# Patient Record
Sex: Male | Born: 1960
Health system: Southern US, Community
[De-identification: ages and names within clinical notes are randomized; demographics above are authoritative.]

## PROBLEM LIST (undated history)

## (undated) DIAGNOSIS — R000 Tachycardia, unspecified: Secondary | ICD-10-CM

## (undated) DIAGNOSIS — J449 Chronic obstructive pulmonary disease, unspecified: Secondary | ICD-10-CM

## (undated) DIAGNOSIS — I639 Cerebral infarction, unspecified: Secondary | ICD-10-CM

## (undated) HISTORY — PX: CHOLECYSTECTOMY: SHX55

---

## 2016-10-24 DIAGNOSIS — E559 Vitamin D deficiency, unspecified: Secondary | ICD-10-CM | POA: Diagnosis not present

## 2016-10-24 DIAGNOSIS — Z720 Tobacco use: Secondary | ICD-10-CM | POA: Diagnosis not present

## 2016-10-24 DIAGNOSIS — J449 Chronic obstructive pulmonary disease, unspecified: Secondary | ICD-10-CM | POA: Diagnosis not present

## 2016-10-24 DIAGNOSIS — K5790 Diverticulosis of intestine, part unspecified, without perforation or abscess without bleeding: Secondary | ICD-10-CM | POA: Diagnosis not present

## 2016-10-24 DIAGNOSIS — K5900 Constipation, unspecified: Secondary | ICD-10-CM | POA: Diagnosis not present

## 2016-10-24 DIAGNOSIS — G4700 Insomnia, unspecified: Secondary | ICD-10-CM | POA: Diagnosis not present

## 2016-10-24 DIAGNOSIS — R7303 Prediabetes: Secondary | ICD-10-CM | POA: Diagnosis not present

## 2016-10-24 DIAGNOSIS — E785 Hyperlipidemia, unspecified: Secondary | ICD-10-CM | POA: Diagnosis not present

## 2016-11-23 DIAGNOSIS — E785 Hyperlipidemia, unspecified: Secondary | ICD-10-CM | POA: Diagnosis not present

## 2016-11-23 DIAGNOSIS — G4700 Insomnia, unspecified: Secondary | ICD-10-CM | POA: Diagnosis not present

## 2016-11-23 DIAGNOSIS — J449 Chronic obstructive pulmonary disease, unspecified: Secondary | ICD-10-CM | POA: Diagnosis not present

## 2016-11-23 DIAGNOSIS — K5900 Constipation, unspecified: Secondary | ICD-10-CM | POA: Diagnosis not present

## 2016-11-23 DIAGNOSIS — Z720 Tobacco use: Secondary | ICD-10-CM | POA: Diagnosis not present

## 2016-11-23 DIAGNOSIS — E559 Vitamin D deficiency, unspecified: Secondary | ICD-10-CM | POA: Diagnosis not present

## 2016-11-23 DIAGNOSIS — R7303 Prediabetes: Secondary | ICD-10-CM | POA: Diagnosis not present

## 2016-11-23 DIAGNOSIS — K5790 Diverticulosis of intestine, part unspecified, without perforation or abscess without bleeding: Secondary | ICD-10-CM | POA: Diagnosis not present

## 2016-12-06 DIAGNOSIS — R194 Change in bowel habit: Secondary | ICD-10-CM | POA: Diagnosis not present

## 2016-12-06 DIAGNOSIS — Z1211 Encounter for screening for malignant neoplasm of colon: Secondary | ICD-10-CM | POA: Diagnosis not present

## 2016-12-15 DIAGNOSIS — K648 Other hemorrhoids: Secondary | ICD-10-CM | POA: Diagnosis not present

## 2016-12-15 DIAGNOSIS — D120 Benign neoplasm of cecum: Secondary | ICD-10-CM | POA: Diagnosis not present

## 2016-12-15 DIAGNOSIS — Z0000 Encounter for general adult medical examination without abnormal findings: Secondary | ICD-10-CM | POA: Diagnosis not present

## 2016-12-15 DIAGNOSIS — D126 Benign neoplasm of colon, unspecified: Secondary | ICD-10-CM | POA: Diagnosis not present

## 2016-12-15 DIAGNOSIS — K5730 Diverticulosis of large intestine without perforation or abscess without bleeding: Secondary | ICD-10-CM | POA: Diagnosis not present

## 2016-12-15 DIAGNOSIS — Z1211 Encounter for screening for malignant neoplasm of colon: Secondary | ICD-10-CM | POA: Diagnosis not present

## 2016-12-15 DIAGNOSIS — K621 Rectal polyp: Secondary | ICD-10-CM | POA: Diagnosis not present

## 2017-02-20 DIAGNOSIS — R7303 Prediabetes: Secondary | ICD-10-CM | POA: Diagnosis not present

## 2017-02-20 DIAGNOSIS — E785 Hyperlipidemia, unspecified: Secondary | ICD-10-CM | POA: Diagnosis not present

## 2017-02-20 DIAGNOSIS — Z0000 Encounter for general adult medical examination without abnormal findings: Secondary | ICD-10-CM | POA: Diagnosis not present

## 2017-02-20 DIAGNOSIS — K5900 Constipation, unspecified: Secondary | ICD-10-CM | POA: Diagnosis not present

## 2017-02-20 DIAGNOSIS — E559 Vitamin D deficiency, unspecified: Secondary | ICD-10-CM | POA: Diagnosis not present

## 2017-02-20 DIAGNOSIS — K5790 Diverticulosis of intestine, part unspecified, without perforation or abscess without bleeding: Secondary | ICD-10-CM | POA: Diagnosis not present

## 2017-02-20 DIAGNOSIS — G4700 Insomnia, unspecified: Secondary | ICD-10-CM | POA: Diagnosis not present

## 2017-02-20 DIAGNOSIS — J449 Chronic obstructive pulmonary disease, unspecified: Secondary | ICD-10-CM | POA: Diagnosis not present

## 2017-02-20 DIAGNOSIS — Z720 Tobacco use: Secondary | ICD-10-CM | POA: Diagnosis not present

## 2017-05-22 DIAGNOSIS — K5900 Constipation, unspecified: Secondary | ICD-10-CM | POA: Diagnosis not present

## 2017-05-22 DIAGNOSIS — K5790 Diverticulosis of intestine, part unspecified, without perforation or abscess without bleeding: Secondary | ICD-10-CM | POA: Diagnosis not present

## 2017-05-22 DIAGNOSIS — Z5181 Encounter for therapeutic drug level monitoring: Secondary | ICD-10-CM | POA: Diagnosis not present

## 2017-05-22 DIAGNOSIS — G4700 Insomnia, unspecified: Secondary | ICD-10-CM | POA: Diagnosis not present

## 2017-05-22 DIAGNOSIS — R7303 Prediabetes: Secondary | ICD-10-CM | POA: Diagnosis not present

## 2017-05-22 DIAGNOSIS — E559 Vitamin D deficiency, unspecified: Secondary | ICD-10-CM | POA: Diagnosis not present

## 2017-05-22 DIAGNOSIS — Z720 Tobacco use: Secondary | ICD-10-CM | POA: Diagnosis not present

## 2017-05-22 DIAGNOSIS — R002 Palpitations: Secondary | ICD-10-CM | POA: Diagnosis not present

## 2017-05-22 DIAGNOSIS — E785 Hyperlipidemia, unspecified: Secondary | ICD-10-CM | POA: Diagnosis not present

## 2017-05-22 DIAGNOSIS — J449 Chronic obstructive pulmonary disease, unspecified: Secondary | ICD-10-CM | POA: Diagnosis not present

## 2017-05-25 DIAGNOSIS — I10 Essential (primary) hypertension: Secondary | ICD-10-CM | POA: Diagnosis not present

## 2017-05-25 DIAGNOSIS — R7989 Other specified abnormal findings of blood chemistry: Secondary | ICD-10-CM | POA: Diagnosis not present

## 2017-05-25 DIAGNOSIS — R9431 Abnormal electrocardiogram [ECG] [EKG]: Secondary | ICD-10-CM | POA: Diagnosis not present

## 2017-05-25 DIAGNOSIS — R0609 Other forms of dyspnea: Secondary | ICD-10-CM | POA: Diagnosis not present

## 2017-08-01 DIAGNOSIS — E0590 Thyrotoxicosis, unspecified without thyrotoxic crisis or storm: Secondary | ICD-10-CM | POA: Insufficient documentation

## 2017-08-01 DIAGNOSIS — Z23 Encounter for immunization: Secondary | ICD-10-CM | POA: Diagnosis not present

## 2017-09-04 DIAGNOSIS — J449 Chronic obstructive pulmonary disease, unspecified: Secondary | ICD-10-CM | POA: Diagnosis not present

## 2017-09-04 DIAGNOSIS — E0590 Thyrotoxicosis, unspecified without thyrotoxic crisis or storm: Secondary | ICD-10-CM | POA: Diagnosis not present

## 2017-09-04 DIAGNOSIS — K5900 Constipation, unspecified: Secondary | ICD-10-CM | POA: Diagnosis not present

## 2017-09-04 DIAGNOSIS — K5790 Diverticulosis of intestine, part unspecified, without perforation or abscess without bleeding: Secondary | ICD-10-CM | POA: Diagnosis not present

## 2017-09-04 DIAGNOSIS — E559 Vitamin D deficiency, unspecified: Secondary | ICD-10-CM | POA: Diagnosis not present

## 2017-09-04 DIAGNOSIS — E785 Hyperlipidemia, unspecified: Secondary | ICD-10-CM | POA: Diagnosis not present

## 2017-09-04 DIAGNOSIS — R630 Anorexia: Secondary | ICD-10-CM | POA: Diagnosis not present

## 2017-09-04 DIAGNOSIS — R7303 Prediabetes: Secondary | ICD-10-CM | POA: Diagnosis not present

## 2017-09-04 DIAGNOSIS — R002 Palpitations: Secondary | ICD-10-CM | POA: Diagnosis not present

## 2017-09-04 DIAGNOSIS — I10 Essential (primary) hypertension: Secondary | ICD-10-CM | POA: Diagnosis not present

## 2017-09-04 DIAGNOSIS — G4700 Insomnia, unspecified: Secondary | ICD-10-CM | POA: Diagnosis not present

## 2017-09-04 DIAGNOSIS — R0609 Other forms of dyspnea: Secondary | ICD-10-CM | POA: Diagnosis not present

## 2017-09-04 DIAGNOSIS — Z720 Tobacco use: Secondary | ICD-10-CM | POA: Diagnosis not present

## 2017-09-18 DIAGNOSIS — E0590 Thyrotoxicosis, unspecified without thyrotoxic crisis or storm: Secondary | ICD-10-CM | POA: Diagnosis not present

## 2017-11-06 DIAGNOSIS — E785 Hyperlipidemia, unspecified: Secondary | ICD-10-CM | POA: Diagnosis not present

## 2017-11-06 DIAGNOSIS — I10 Essential (primary) hypertension: Secondary | ICD-10-CM | POA: Diagnosis not present

## 2017-11-06 DIAGNOSIS — E559 Vitamin D deficiency, unspecified: Secondary | ICD-10-CM | POA: Diagnosis not present

## 2017-11-06 DIAGNOSIS — K5790 Diverticulosis of intestine, part unspecified, without perforation or abscess without bleeding: Secondary | ICD-10-CM | POA: Diagnosis not present

## 2017-11-06 DIAGNOSIS — K5900 Constipation, unspecified: Secondary | ICD-10-CM | POA: Diagnosis not present

## 2017-11-06 DIAGNOSIS — R7303 Prediabetes: Secondary | ICD-10-CM | POA: Diagnosis not present

## 2017-11-06 DIAGNOSIS — E0590 Thyrotoxicosis, unspecified without thyrotoxic crisis or storm: Secondary | ICD-10-CM | POA: Diagnosis not present

## 2017-11-06 DIAGNOSIS — G4700 Insomnia, unspecified: Secondary | ICD-10-CM | POA: Diagnosis not present

## 2017-11-06 DIAGNOSIS — Z720 Tobacco use: Secondary | ICD-10-CM | POA: Diagnosis not present

## 2017-11-06 DIAGNOSIS — J449 Chronic obstructive pulmonary disease, unspecified: Secondary | ICD-10-CM | POA: Diagnosis not present

## 2017-12-04 DIAGNOSIS — E0590 Thyrotoxicosis, unspecified without thyrotoxic crisis or storm: Secondary | ICD-10-CM | POA: Diagnosis not present

## 2017-12-04 DIAGNOSIS — R7303 Prediabetes: Secondary | ICD-10-CM | POA: Diagnosis not present

## 2017-12-04 DIAGNOSIS — J449 Chronic obstructive pulmonary disease, unspecified: Secondary | ICD-10-CM | POA: Diagnosis not present

## 2017-12-04 DIAGNOSIS — K5790 Diverticulosis of intestine, part unspecified, without perforation or abscess without bleeding: Secondary | ICD-10-CM | POA: Diagnosis not present

## 2017-12-04 DIAGNOSIS — Z720 Tobacco use: Secondary | ICD-10-CM | POA: Diagnosis not present

## 2017-12-04 DIAGNOSIS — I10 Essential (primary) hypertension: Secondary | ICD-10-CM | POA: Diagnosis not present

## 2017-12-04 DIAGNOSIS — E559 Vitamin D deficiency, unspecified: Secondary | ICD-10-CM | POA: Diagnosis not present

## 2017-12-04 DIAGNOSIS — E785 Hyperlipidemia, unspecified: Secondary | ICD-10-CM | POA: Diagnosis not present

## 2017-12-04 DIAGNOSIS — G4700 Insomnia, unspecified: Secondary | ICD-10-CM | POA: Diagnosis not present

## 2017-12-04 DIAGNOSIS — K5900 Constipation, unspecified: Secondary | ICD-10-CM | POA: Diagnosis not present

## 2018-02-21 DIAGNOSIS — K5790 Diverticulosis of intestine, part unspecified, without perforation or abscess without bleeding: Secondary | ICD-10-CM | POA: Diagnosis not present

## 2018-02-21 DIAGNOSIS — E0590 Thyrotoxicosis, unspecified without thyrotoxic crisis or storm: Secondary | ICD-10-CM | POA: Diagnosis not present

## 2018-02-21 DIAGNOSIS — Z720 Tobacco use: Secondary | ICD-10-CM | POA: Diagnosis not present

## 2018-02-21 DIAGNOSIS — E785 Hyperlipidemia, unspecified: Secondary | ICD-10-CM | POA: Diagnosis not present

## 2018-02-21 DIAGNOSIS — R7303 Prediabetes: Secondary | ICD-10-CM | POA: Diagnosis not present

## 2018-02-21 DIAGNOSIS — Z0000 Encounter for general adult medical examination without abnormal findings: Secondary | ICD-10-CM | POA: Diagnosis not present

## 2018-02-21 DIAGNOSIS — I10 Essential (primary) hypertension: Secondary | ICD-10-CM | POA: Diagnosis not present

## 2018-02-21 DIAGNOSIS — G4700 Insomnia, unspecified: Secondary | ICD-10-CM | POA: Diagnosis not present

## 2018-02-21 DIAGNOSIS — K5900 Constipation, unspecified: Secondary | ICD-10-CM | POA: Diagnosis not present

## 2018-02-21 DIAGNOSIS — J449 Chronic obstructive pulmonary disease, unspecified: Secondary | ICD-10-CM | POA: Diagnosis not present

## 2018-02-21 DIAGNOSIS — E559 Vitamin D deficiency, unspecified: Secondary | ICD-10-CM | POA: Diagnosis not present

## 2018-04-04 DIAGNOSIS — E0590 Thyrotoxicosis, unspecified without thyrotoxic crisis or storm: Secondary | ICD-10-CM | POA: Diagnosis not present

## 2018-04-12 DIAGNOSIS — I10 Essential (primary) hypertension: Secondary | ICD-10-CM | POA: Diagnosis not present

## 2018-04-12 DIAGNOSIS — Z720 Tobacco use: Secondary | ICD-10-CM | POA: Diagnosis not present

## 2018-04-12 DIAGNOSIS — J449 Chronic obstructive pulmonary disease, unspecified: Secondary | ICD-10-CM | POA: Diagnosis not present

## 2018-04-12 DIAGNOSIS — T7840XA Allergy, unspecified, initial encounter: Secondary | ICD-10-CM | POA: Diagnosis not present

## 2018-04-12 DIAGNOSIS — E785 Hyperlipidemia, unspecified: Secondary | ICD-10-CM | POA: Diagnosis not present

## 2018-04-12 DIAGNOSIS — E559 Vitamin D deficiency, unspecified: Secondary | ICD-10-CM | POA: Diagnosis not present

## 2018-04-12 DIAGNOSIS — Z01118 Encounter for examination of ears and hearing with other abnormal findings: Secondary | ICD-10-CM | POA: Diagnosis not present

## 2018-04-12 DIAGNOSIS — G4700 Insomnia, unspecified: Secondary | ICD-10-CM | POA: Diagnosis not present

## 2018-04-12 DIAGNOSIS — E0590 Thyrotoxicosis, unspecified without thyrotoxic crisis or storm: Secondary | ICD-10-CM | POA: Diagnosis not present

## 2018-04-12 DIAGNOSIS — K5900 Constipation, unspecified: Secondary | ICD-10-CM | POA: Diagnosis not present

## 2018-04-12 DIAGNOSIS — R7303 Prediabetes: Secondary | ICD-10-CM | POA: Diagnosis not present

## 2018-04-12 DIAGNOSIS — K5790 Diverticulosis of intestine, part unspecified, without perforation or abscess without bleeding: Secondary | ICD-10-CM | POA: Diagnosis not present

## 2018-04-12 DIAGNOSIS — Z125 Encounter for screening for malignant neoplasm of prostate: Secondary | ICD-10-CM | POA: Diagnosis not present

## 2018-05-30 DIAGNOSIS — Z720 Tobacco use: Secondary | ICD-10-CM | POA: Diagnosis not present

## 2018-05-30 DIAGNOSIS — K5900 Constipation, unspecified: Secondary | ICD-10-CM | POA: Diagnosis not present

## 2018-05-30 DIAGNOSIS — I10 Essential (primary) hypertension: Secondary | ICD-10-CM | POA: Diagnosis not present

## 2018-05-30 DIAGNOSIS — G4700 Insomnia, unspecified: Secondary | ICD-10-CM | POA: Diagnosis not present

## 2018-05-30 DIAGNOSIS — E559 Vitamin D deficiency, unspecified: Secondary | ICD-10-CM | POA: Diagnosis not present

## 2018-05-30 DIAGNOSIS — J449 Chronic obstructive pulmonary disease, unspecified: Secondary | ICD-10-CM | POA: Diagnosis not present

## 2018-05-30 DIAGNOSIS — E0590 Thyrotoxicosis, unspecified without thyrotoxic crisis or storm: Secondary | ICD-10-CM | POA: Diagnosis not present

## 2018-05-30 DIAGNOSIS — E785 Hyperlipidemia, unspecified: Secondary | ICD-10-CM | POA: Diagnosis not present

## 2018-05-30 DIAGNOSIS — K5790 Diverticulosis of intestine, part unspecified, without perforation or abscess without bleeding: Secondary | ICD-10-CM | POA: Diagnosis not present

## 2018-05-30 DIAGNOSIS — R7303 Prediabetes: Secondary | ICD-10-CM | POA: Diagnosis not present

## 2018-07-20 DIAGNOSIS — Z23 Encounter for immunization: Secondary | ICD-10-CM | POA: Diagnosis not present

## 2018-08-05 DIAGNOSIS — E0590 Thyrotoxicosis, unspecified without thyrotoxic crisis or storm: Secondary | ICD-10-CM | POA: Diagnosis not present

## 2018-08-13 DIAGNOSIS — Z720 Tobacco use: Secondary | ICD-10-CM | POA: Diagnosis not present

## 2018-08-13 DIAGNOSIS — I10 Essential (primary) hypertension: Secondary | ICD-10-CM | POA: Diagnosis not present

## 2018-08-13 DIAGNOSIS — R7303 Prediabetes: Secondary | ICD-10-CM | POA: Diagnosis not present

## 2018-08-13 DIAGNOSIS — E785 Hyperlipidemia, unspecified: Secondary | ICD-10-CM | POA: Diagnosis not present

## 2018-08-13 DIAGNOSIS — K5790 Diverticulosis of intestine, part unspecified, without perforation or abscess without bleeding: Secondary | ICD-10-CM | POA: Diagnosis not present

## 2018-08-13 DIAGNOSIS — E559 Vitamin D deficiency, unspecified: Secondary | ICD-10-CM | POA: Diagnosis not present

## 2018-08-13 DIAGNOSIS — K5900 Constipation, unspecified: Secondary | ICD-10-CM | POA: Diagnosis not present

## 2018-08-13 DIAGNOSIS — J449 Chronic obstructive pulmonary disease, unspecified: Secondary | ICD-10-CM | POA: Diagnosis not present

## 2018-08-13 DIAGNOSIS — G4700 Insomnia, unspecified: Secondary | ICD-10-CM | POA: Diagnosis not present

## 2018-08-13 DIAGNOSIS — E0590 Thyrotoxicosis, unspecified without thyrotoxic crisis or storm: Secondary | ICD-10-CM | POA: Diagnosis not present

## 2018-10-17 DIAGNOSIS — G4700 Insomnia, unspecified: Secondary | ICD-10-CM | POA: Diagnosis not present

## 2018-10-17 DIAGNOSIS — Z125 Encounter for screening for malignant neoplasm of prostate: Secondary | ICD-10-CM | POA: Diagnosis not present

## 2018-10-17 DIAGNOSIS — R7303 Prediabetes: Secondary | ICD-10-CM | POA: Diagnosis not present

## 2018-10-17 DIAGNOSIS — I10 Essential (primary) hypertension: Secondary | ICD-10-CM | POA: Diagnosis not present

## 2018-10-17 DIAGNOSIS — N410 Acute prostatitis: Secondary | ICD-10-CM | POA: Diagnosis not present

## 2018-10-17 DIAGNOSIS — Z720 Tobacco use: Secondary | ICD-10-CM | POA: Diagnosis not present

## 2018-10-17 DIAGNOSIS — K5790 Diverticulosis of intestine, part unspecified, without perforation or abscess without bleeding: Secondary | ICD-10-CM | POA: Diagnosis not present

## 2018-10-17 DIAGNOSIS — E0590 Thyrotoxicosis, unspecified without thyrotoxic crisis or storm: Secondary | ICD-10-CM | POA: Diagnosis not present

## 2018-10-17 DIAGNOSIS — K5900 Constipation, unspecified: Secondary | ICD-10-CM | POA: Diagnosis not present

## 2018-10-17 DIAGNOSIS — E559 Vitamin D deficiency, unspecified: Secondary | ICD-10-CM | POA: Diagnosis not present

## 2018-10-17 DIAGNOSIS — E785 Hyperlipidemia, unspecified: Secondary | ICD-10-CM | POA: Diagnosis not present

## 2018-10-17 DIAGNOSIS — J449 Chronic obstructive pulmonary disease, unspecified: Secondary | ICD-10-CM | POA: Diagnosis not present

## 2018-10-29 DIAGNOSIS — E0590 Thyrotoxicosis, unspecified without thyrotoxic crisis or storm: Secondary | ICD-10-CM | POA: Diagnosis not present

## 2018-10-29 DIAGNOSIS — G4700 Insomnia, unspecified: Secondary | ICD-10-CM | POA: Diagnosis not present

## 2018-10-29 DIAGNOSIS — E785 Hyperlipidemia, unspecified: Secondary | ICD-10-CM | POA: Diagnosis not present

## 2018-10-29 DIAGNOSIS — Z720 Tobacco use: Secondary | ICD-10-CM | POA: Diagnosis not present

## 2018-10-29 DIAGNOSIS — R7303 Prediabetes: Secondary | ICD-10-CM | POA: Diagnosis not present

## 2018-10-29 DIAGNOSIS — I10 Essential (primary) hypertension: Secondary | ICD-10-CM | POA: Diagnosis not present

## 2018-10-29 DIAGNOSIS — K5900 Constipation, unspecified: Secondary | ICD-10-CM | POA: Diagnosis not present

## 2018-10-29 DIAGNOSIS — E559 Vitamin D deficiency, unspecified: Secondary | ICD-10-CM | POA: Diagnosis not present

## 2018-10-29 DIAGNOSIS — J449 Chronic obstructive pulmonary disease, unspecified: Secondary | ICD-10-CM | POA: Diagnosis not present

## 2018-12-09 DIAGNOSIS — E0590 Thyrotoxicosis, unspecified without thyrotoxic crisis or storm: Secondary | ICD-10-CM | POA: Diagnosis not present

## 2019-02-25 DIAGNOSIS — E0590 Thyrotoxicosis, unspecified without thyrotoxic crisis or storm: Secondary | ICD-10-CM | POA: Diagnosis not present

## 2019-02-25 DIAGNOSIS — I10 Essential (primary) hypertension: Secondary | ICD-10-CM | POA: Diagnosis not present

## 2019-02-25 DIAGNOSIS — E785 Hyperlipidemia, unspecified: Secondary | ICD-10-CM | POA: Diagnosis not present

## 2019-02-25 DIAGNOSIS — R7303 Prediabetes: Secondary | ICD-10-CM | POA: Diagnosis not present

## 2019-02-25 DIAGNOSIS — K5900 Constipation, unspecified: Secondary | ICD-10-CM | POA: Diagnosis not present

## 2019-02-25 DIAGNOSIS — E559 Vitamin D deficiency, unspecified: Secondary | ICD-10-CM | POA: Diagnosis not present

## 2019-02-25 DIAGNOSIS — Z720 Tobacco use: Secondary | ICD-10-CM | POA: Diagnosis not present

## 2019-02-25 DIAGNOSIS — G4700 Insomnia, unspecified: Secondary | ICD-10-CM | POA: Diagnosis not present

## 2019-02-25 DIAGNOSIS — J449 Chronic obstructive pulmonary disease, unspecified: Secondary | ICD-10-CM | POA: Diagnosis not present

## 2019-07-23 DIAGNOSIS — E0590 Thyrotoxicosis, unspecified without thyrotoxic crisis or storm: Secondary | ICD-10-CM | POA: Diagnosis not present

## 2019-07-29 DIAGNOSIS — I10 Essential (primary) hypertension: Secondary | ICD-10-CM | POA: Diagnosis not present

## 2019-07-29 DIAGNOSIS — E559 Vitamin D deficiency, unspecified: Secondary | ICD-10-CM | POA: Diagnosis not present

## 2019-07-29 DIAGNOSIS — G4700 Insomnia, unspecified: Secondary | ICD-10-CM | POA: Diagnosis not present

## 2019-07-29 DIAGNOSIS — J449 Chronic obstructive pulmonary disease, unspecified: Secondary | ICD-10-CM | POA: Diagnosis not present

## 2019-07-29 DIAGNOSIS — K5900 Constipation, unspecified: Secondary | ICD-10-CM | POA: Diagnosis not present

## 2019-07-29 DIAGNOSIS — Z0001 Encounter for general adult medical examination with abnormal findings: Secondary | ICD-10-CM | POA: Diagnosis not present

## 2019-07-29 DIAGNOSIS — E0590 Thyrotoxicosis, unspecified without thyrotoxic crisis or storm: Secondary | ICD-10-CM | POA: Diagnosis not present

## 2019-07-29 DIAGNOSIS — Z1389 Encounter for screening for other disorder: Secondary | ICD-10-CM | POA: Diagnosis not present

## 2019-07-29 DIAGNOSIS — E785 Hyperlipidemia, unspecified: Secondary | ICD-10-CM | POA: Diagnosis not present

## 2019-07-29 DIAGNOSIS — Z720 Tobacco use: Secondary | ICD-10-CM | POA: Diagnosis not present

## 2019-07-29 DIAGNOSIS — R7303 Prediabetes: Secondary | ICD-10-CM | POA: Diagnosis not present

## 2019-07-29 DIAGNOSIS — Z23 Encounter for immunization: Secondary | ICD-10-CM | POA: Diagnosis not present

## 2019-09-25 DIAGNOSIS — E0590 Thyrotoxicosis, unspecified without thyrotoxic crisis or storm: Secondary | ICD-10-CM | POA: Diagnosis not present

## 2019-10-30 DIAGNOSIS — E0590 Thyrotoxicosis, unspecified without thyrotoxic crisis or storm: Secondary | ICD-10-CM | POA: Diagnosis not present

## 2019-10-30 DIAGNOSIS — R7303 Prediabetes: Secondary | ICD-10-CM | POA: Diagnosis not present

## 2019-10-30 DIAGNOSIS — E785 Hyperlipidemia, unspecified: Secondary | ICD-10-CM | POA: Diagnosis not present

## 2019-10-30 DIAGNOSIS — K5900 Constipation, unspecified: Secondary | ICD-10-CM | POA: Diagnosis not present

## 2019-10-30 DIAGNOSIS — E559 Vitamin D deficiency, unspecified: Secondary | ICD-10-CM | POA: Diagnosis not present

## 2019-10-30 DIAGNOSIS — I10 Essential (primary) hypertension: Secondary | ICD-10-CM | POA: Diagnosis not present

## 2019-10-30 DIAGNOSIS — Z720 Tobacco use: Secondary | ICD-10-CM | POA: Diagnosis not present

## 2019-10-30 DIAGNOSIS — Z131 Encounter for screening for diabetes mellitus: Secondary | ICD-10-CM | POA: Diagnosis not present

## 2019-10-30 DIAGNOSIS — Z125 Encounter for screening for malignant neoplasm of prostate: Secondary | ICD-10-CM | POA: Diagnosis not present

## 2019-10-30 DIAGNOSIS — J449 Chronic obstructive pulmonary disease, unspecified: Secondary | ICD-10-CM | POA: Diagnosis not present

## 2019-10-30 DIAGNOSIS — G4700 Insomnia, unspecified: Secondary | ICD-10-CM | POA: Diagnosis not present

## 2019-12-02 DIAGNOSIS — E0590 Thyrotoxicosis, unspecified without thyrotoxic crisis or storm: Secondary | ICD-10-CM | POA: Diagnosis not present

## 2020-01-27 DIAGNOSIS — Z720 Tobacco use: Secondary | ICD-10-CM | POA: Diagnosis not present

## 2020-01-27 DIAGNOSIS — G4700 Insomnia, unspecified: Secondary | ICD-10-CM | POA: Diagnosis not present

## 2020-01-27 DIAGNOSIS — R7303 Prediabetes: Secondary | ICD-10-CM | POA: Diagnosis not present

## 2020-01-27 DIAGNOSIS — E785 Hyperlipidemia, unspecified: Secondary | ICD-10-CM | POA: Diagnosis not present

## 2020-01-27 DIAGNOSIS — I10 Essential (primary) hypertension: Secondary | ICD-10-CM | POA: Diagnosis not present

## 2020-01-27 DIAGNOSIS — K5900 Constipation, unspecified: Secondary | ICD-10-CM | POA: Diagnosis not present

## 2020-01-27 DIAGNOSIS — E0590 Thyrotoxicosis, unspecified without thyrotoxic crisis or storm: Secondary | ICD-10-CM | POA: Diagnosis not present

## 2020-01-27 DIAGNOSIS — J449 Chronic obstructive pulmonary disease, unspecified: Secondary | ICD-10-CM | POA: Diagnosis not present

## 2020-01-27 DIAGNOSIS — E559 Vitamin D deficiency, unspecified: Secondary | ICD-10-CM | POA: Diagnosis not present

## 2020-02-11 DIAGNOSIS — Z23 Encounter for immunization: Secondary | ICD-10-CM | POA: Diagnosis not present

## 2020-03-01 DIAGNOSIS — E0590 Thyrotoxicosis, unspecified without thyrotoxic crisis or storm: Secondary | ICD-10-CM | POA: Diagnosis not present

## 2020-03-04 DIAGNOSIS — Z23 Encounter for immunization: Secondary | ICD-10-CM | POA: Diagnosis not present

## 2020-03-30 DIAGNOSIS — E0590 Thyrotoxicosis, unspecified without thyrotoxic crisis or storm: Secondary | ICD-10-CM | POA: Diagnosis not present

## 2020-03-30 DIAGNOSIS — K5900 Constipation, unspecified: Secondary | ICD-10-CM | POA: Diagnosis not present

## 2020-03-30 DIAGNOSIS — R7303 Prediabetes: Secondary | ICD-10-CM | POA: Diagnosis not present

## 2020-03-30 DIAGNOSIS — J449 Chronic obstructive pulmonary disease, unspecified: Secondary | ICD-10-CM | POA: Diagnosis not present

## 2020-03-30 DIAGNOSIS — E785 Hyperlipidemia, unspecified: Secondary | ICD-10-CM | POA: Diagnosis not present

## 2020-03-30 DIAGNOSIS — G4700 Insomnia, unspecified: Secondary | ICD-10-CM | POA: Diagnosis not present

## 2020-03-30 DIAGNOSIS — Z720 Tobacco use: Secondary | ICD-10-CM | POA: Diagnosis not present

## 2020-03-30 DIAGNOSIS — I10 Essential (primary) hypertension: Secondary | ICD-10-CM | POA: Diagnosis not present

## 2020-03-30 DIAGNOSIS — E559 Vitamin D deficiency, unspecified: Secondary | ICD-10-CM | POA: Diagnosis not present

## 2020-03-30 DIAGNOSIS — R1013 Epigastric pain: Secondary | ICD-10-CM | POA: Diagnosis not present

## 2020-04-13 DIAGNOSIS — Z720 Tobacco use: Secondary | ICD-10-CM | POA: Diagnosis not present

## 2020-04-13 DIAGNOSIS — Z131 Encounter for screening for diabetes mellitus: Secondary | ICD-10-CM | POA: Diagnosis not present

## 2020-04-13 DIAGNOSIS — R42 Dizziness and giddiness: Secondary | ICD-10-CM | POA: Diagnosis not present

## 2020-04-13 DIAGNOSIS — G4700 Insomnia, unspecified: Secondary | ICD-10-CM | POA: Diagnosis not present

## 2020-04-13 DIAGNOSIS — I10 Essential (primary) hypertension: Secondary | ICD-10-CM | POA: Diagnosis not present

## 2020-04-13 DIAGNOSIS — K5900 Constipation, unspecified: Secondary | ICD-10-CM | POA: Diagnosis not present

## 2020-04-13 DIAGNOSIS — E785 Hyperlipidemia, unspecified: Secondary | ICD-10-CM | POA: Diagnosis not present

## 2020-04-13 DIAGNOSIS — J449 Chronic obstructive pulmonary disease, unspecified: Secondary | ICD-10-CM | POA: Diagnosis not present

## 2020-04-13 DIAGNOSIS — E0590 Thyrotoxicosis, unspecified without thyrotoxic crisis or storm: Secondary | ICD-10-CM | POA: Diagnosis not present

## 2020-04-13 DIAGNOSIS — E559 Vitamin D deficiency, unspecified: Secondary | ICD-10-CM | POA: Diagnosis not present

## 2020-04-13 DIAGNOSIS — R7303 Prediabetes: Secondary | ICD-10-CM | POA: Diagnosis not present

## 2020-04-23 DIAGNOSIS — M50323 Other cervical disc degeneration at C6-C7 level: Secondary | ICD-10-CM | POA: Diagnosis not present

## 2020-04-23 DIAGNOSIS — M50322 Other cervical disc degeneration at C5-C6 level: Secondary | ICD-10-CM | POA: Diagnosis not present

## 2020-04-23 DIAGNOSIS — G8911 Acute pain due to trauma: Secondary | ICD-10-CM | POA: Diagnosis not present

## 2020-04-23 DIAGNOSIS — Z881 Allergy status to other antibiotic agents status: Secondary | ICD-10-CM | POA: Diagnosis not present

## 2020-04-23 DIAGNOSIS — F17210 Nicotine dependence, cigarettes, uncomplicated: Secondary | ICD-10-CM | POA: Diagnosis not present

## 2020-04-23 DIAGNOSIS — S161XXA Strain of muscle, fascia and tendon at neck level, initial encounter: Secondary | ICD-10-CM | POA: Diagnosis not present

## 2020-04-23 DIAGNOSIS — I10 Essential (primary) hypertension: Secondary | ICD-10-CM | POA: Diagnosis not present

## 2020-04-23 DIAGNOSIS — Z882 Allergy status to sulfonamides status: Secondary | ICD-10-CM | POA: Diagnosis not present

## 2020-04-23 DIAGNOSIS — S199XXA Unspecified injury of neck, initial encounter: Secondary | ICD-10-CM | POA: Diagnosis not present

## 2020-04-23 DIAGNOSIS — J449 Chronic obstructive pulmonary disease, unspecified: Secondary | ICD-10-CM | POA: Diagnosis not present

## 2020-04-23 DIAGNOSIS — M5000 Cervical disc disorder with myelopathy, unspecified cervical region: Secondary | ICD-10-CM | POA: Diagnosis not present

## 2020-04-23 DIAGNOSIS — Z880 Allergy status to penicillin: Secondary | ICD-10-CM | POA: Diagnosis not present

## 2020-04-27 DIAGNOSIS — Z720 Tobacco use: Secondary | ICD-10-CM | POA: Diagnosis not present

## 2020-04-27 DIAGNOSIS — K5900 Constipation, unspecified: Secondary | ICD-10-CM | POA: Diagnosis not present

## 2020-04-27 DIAGNOSIS — E559 Vitamin D deficiency, unspecified: Secondary | ICD-10-CM | POA: Diagnosis not present

## 2020-04-27 DIAGNOSIS — I10 Essential (primary) hypertension: Secondary | ICD-10-CM | POA: Diagnosis not present

## 2020-04-27 DIAGNOSIS — G4700 Insomnia, unspecified: Secondary | ICD-10-CM | POA: Diagnosis not present

## 2020-04-27 DIAGNOSIS — E785 Hyperlipidemia, unspecified: Secondary | ICD-10-CM | POA: Diagnosis not present

## 2020-04-27 DIAGNOSIS — R7303 Prediabetes: Secondary | ICD-10-CM | POA: Diagnosis not present

## 2020-04-27 DIAGNOSIS — E0590 Thyrotoxicosis, unspecified without thyrotoxic crisis or storm: Secondary | ICD-10-CM | POA: Diagnosis not present

## 2020-04-27 DIAGNOSIS — J449 Chronic obstructive pulmonary disease, unspecified: Secondary | ICD-10-CM | POA: Diagnosis not present

## 2020-05-25 DIAGNOSIS — K5900 Constipation, unspecified: Secondary | ICD-10-CM | POA: Diagnosis not present

## 2020-05-25 DIAGNOSIS — J449 Chronic obstructive pulmonary disease, unspecified: Secondary | ICD-10-CM | POA: Diagnosis not present

## 2020-05-25 DIAGNOSIS — E785 Hyperlipidemia, unspecified: Secondary | ICD-10-CM | POA: Diagnosis not present

## 2020-05-25 DIAGNOSIS — E559 Vitamin D deficiency, unspecified: Secondary | ICD-10-CM | POA: Diagnosis not present

## 2020-05-25 DIAGNOSIS — G4700 Insomnia, unspecified: Secondary | ICD-10-CM | POA: Diagnosis not present

## 2020-05-25 DIAGNOSIS — R7303 Prediabetes: Secondary | ICD-10-CM | POA: Diagnosis not present

## 2020-05-25 DIAGNOSIS — E0590 Thyrotoxicosis, unspecified without thyrotoxic crisis or storm: Secondary | ICD-10-CM | POA: Diagnosis not present

## 2020-05-25 DIAGNOSIS — I10 Essential (primary) hypertension: Secondary | ICD-10-CM | POA: Diagnosis not present

## 2020-05-25 DIAGNOSIS — M542 Cervicalgia: Secondary | ICD-10-CM | POA: Diagnosis not present

## 2020-05-25 DIAGNOSIS — Z720 Tobacco use: Secondary | ICD-10-CM | POA: Diagnosis not present

## 2020-06-28 DIAGNOSIS — E0590 Thyrotoxicosis, unspecified without thyrotoxic crisis or storm: Secondary | ICD-10-CM | POA: Diagnosis not present

## 2020-08-02 DIAGNOSIS — Z23 Encounter for immunization: Secondary | ICD-10-CM | POA: Diagnosis not present

## 2020-08-24 DIAGNOSIS — E782 Mixed hyperlipidemia: Secondary | ICD-10-CM | POA: Diagnosis not present

## 2020-08-24 DIAGNOSIS — Z720 Tobacco use: Secondary | ICD-10-CM | POA: Diagnosis not present

## 2020-08-24 DIAGNOSIS — Z789 Other specified health status: Secondary | ICD-10-CM | POA: Diagnosis not present

## 2020-08-24 DIAGNOSIS — I10 Essential (primary) hypertension: Secondary | ICD-10-CM | POA: Diagnosis not present

## 2020-08-24 DIAGNOSIS — J449 Chronic obstructive pulmonary disease, unspecified: Secondary | ICD-10-CM | POA: Diagnosis not present

## 2020-08-24 DIAGNOSIS — R7303 Prediabetes: Secondary | ICD-10-CM | POA: Diagnosis not present

## 2020-08-24 DIAGNOSIS — E559 Vitamin D deficiency, unspecified: Secondary | ICD-10-CM | POA: Diagnosis not present

## 2020-08-24 DIAGNOSIS — E0590 Thyrotoxicosis, unspecified without thyrotoxic crisis or storm: Secondary | ICD-10-CM | POA: Diagnosis not present

## 2020-08-24 DIAGNOSIS — Z0001 Encounter for general adult medical examination with abnormal findings: Secondary | ICD-10-CM | POA: Diagnosis not present

## 2020-10-04 DIAGNOSIS — Z23 Encounter for immunization: Secondary | ICD-10-CM | POA: Diagnosis not present

## 2020-10-25 DIAGNOSIS — J449 Chronic obstructive pulmonary disease, unspecified: Secondary | ICD-10-CM | POA: Diagnosis not present

## 2020-10-25 DIAGNOSIS — E559 Vitamin D deficiency, unspecified: Secondary | ICD-10-CM | POA: Diagnosis not present

## 2020-10-25 DIAGNOSIS — R7303 Prediabetes: Secondary | ICD-10-CM | POA: Diagnosis not present

## 2020-10-25 DIAGNOSIS — Z125 Encounter for screening for malignant neoplasm of prostate: Secondary | ICD-10-CM | POA: Diagnosis not present

## 2020-10-25 DIAGNOSIS — E782 Mixed hyperlipidemia: Secondary | ICD-10-CM | POA: Diagnosis not present

## 2020-10-25 DIAGNOSIS — Z720 Tobacco use: Secondary | ICD-10-CM | POA: Diagnosis not present

## 2020-10-25 DIAGNOSIS — I10 Essential (primary) hypertension: Secondary | ICD-10-CM | POA: Diagnosis not present

## 2020-12-27 DIAGNOSIS — E0590 Thyrotoxicosis, unspecified without thyrotoxic crisis or storm: Secondary | ICD-10-CM | POA: Diagnosis not present

## 2021-03-10 DIAGNOSIS — R7303 Prediabetes: Secondary | ICD-10-CM | POA: Diagnosis not present

## 2021-03-10 DIAGNOSIS — E782 Mixed hyperlipidemia: Secondary | ICD-10-CM | POA: Diagnosis not present

## 2021-03-10 DIAGNOSIS — E559 Vitamin D deficiency, unspecified: Secondary | ICD-10-CM | POA: Diagnosis not present

## 2021-03-10 DIAGNOSIS — J449 Chronic obstructive pulmonary disease, unspecified: Secondary | ICD-10-CM | POA: Diagnosis not present

## 2021-03-10 DIAGNOSIS — Z720 Tobacco use: Secondary | ICD-10-CM | POA: Diagnosis not present

## 2021-03-10 DIAGNOSIS — I10 Essential (primary) hypertension: Secondary | ICD-10-CM | POA: Diagnosis not present

## 2021-03-24 DIAGNOSIS — Z881 Allergy status to other antibiotic agents status: Secondary | ICD-10-CM | POA: Diagnosis not present

## 2021-03-24 DIAGNOSIS — Z886 Allergy status to analgesic agent status: Secondary | ICD-10-CM | POA: Diagnosis not present

## 2021-03-24 DIAGNOSIS — J449 Chronic obstructive pulmonary disease, unspecified: Secondary | ICD-10-CM | POA: Diagnosis not present

## 2021-03-24 DIAGNOSIS — F17290 Nicotine dependence, other tobacco product, uncomplicated: Secondary | ICD-10-CM | POA: Diagnosis not present

## 2021-03-24 DIAGNOSIS — M25562 Pain in left knee: Secondary | ICD-10-CM | POA: Diagnosis not present

## 2021-03-24 DIAGNOSIS — Z880 Allergy status to penicillin: Secondary | ICD-10-CM | POA: Diagnosis not present

## 2021-03-24 DIAGNOSIS — Z882 Allergy status to sulfonamides status: Secondary | ICD-10-CM | POA: Diagnosis not present

## 2021-03-24 DIAGNOSIS — M11262 Other chondrocalcinosis, left knee: Secondary | ICD-10-CM | POA: Diagnosis not present

## 2021-03-24 DIAGNOSIS — I10 Essential (primary) hypertension: Secondary | ICD-10-CM | POA: Diagnosis not present

## 2021-06-15 DIAGNOSIS — U071 COVID-19: Secondary | ICD-10-CM | POA: Diagnosis not present

## 2021-06-29 DIAGNOSIS — E0590 Thyrotoxicosis, unspecified without thyrotoxic crisis or storm: Secondary | ICD-10-CM | POA: Diagnosis not present

## 2021-07-08 ENCOUNTER — Emergency Department (HOSPITAL_COMMUNITY): Payer: Medicare Other

## 2021-07-08 ENCOUNTER — Inpatient Hospital Stay (HOSPITAL_COMMUNITY)
Admission: EM | Admit: 2021-07-08 | Discharge: 2021-07-11 | DRG: 065 | Disposition: A | Discharge status: Home / Self Care | Payer: Medicare Other | Attending: Internal Medicine | Admitting: Internal Medicine

## 2021-07-08 ENCOUNTER — Inpatient Hospital Stay (HOSPITAL_COMMUNITY): Payer: Medicare Other

## 2021-07-08 ENCOUNTER — Other Ambulatory Visit: Payer: Self-pay

## 2021-07-08 ENCOUNTER — Encounter (HOSPITAL_COMMUNITY): Payer: Self-pay

## 2021-07-08 DIAGNOSIS — Q211 Atrial septal defect: Secondary | ICD-10-CM

## 2021-07-08 DIAGNOSIS — R414 Neurologic neglect syndrome: Secondary | ICD-10-CM | POA: Diagnosis present

## 2021-07-08 DIAGNOSIS — G8194 Hemiplegia, unspecified affecting left nondominant side: Secondary | ICD-10-CM | POA: Diagnosis present

## 2021-07-08 DIAGNOSIS — F17210 Nicotine dependence, cigarettes, uncomplicated: Secondary | ICD-10-CM | POA: Diagnosis present

## 2021-07-08 DIAGNOSIS — Z881 Allergy status to other antibiotic agents status: Secondary | ICD-10-CM

## 2021-07-08 DIAGNOSIS — J439 Emphysema, unspecified: Secondary | ICD-10-CM | POA: Diagnosis present

## 2021-07-08 DIAGNOSIS — R29718 NIHSS score 18: Secondary | ICD-10-CM | POA: Diagnosis present

## 2021-07-08 DIAGNOSIS — Z79899 Other long term (current) drug therapy: Secondary | ICD-10-CM | POA: Diagnosis not present

## 2021-07-08 DIAGNOSIS — Z20822 Contact with and (suspected) exposure to covid-19: Secondary | ICD-10-CM | POA: Diagnosis present

## 2021-07-08 DIAGNOSIS — I340 Nonrheumatic mitral (valve) insufficiency: Secondary | ICD-10-CM | POA: Diagnosis not present

## 2021-07-08 DIAGNOSIS — I639 Cerebral infarction, unspecified: Secondary | ICD-10-CM | POA: Diagnosis not present

## 2021-07-08 DIAGNOSIS — I6611 Occlusion and stenosis of right anterior cerebral artery: Secondary | ICD-10-CM | POA: Diagnosis not present

## 2021-07-08 DIAGNOSIS — R29810 Facial weakness: Secondary | ICD-10-CM | POA: Diagnosis present

## 2021-07-08 DIAGNOSIS — R918 Other nonspecific abnormal finding of lung field: Secondary | ICD-10-CM | POA: Diagnosis not present

## 2021-07-08 DIAGNOSIS — R471 Dysarthria and anarthria: Secondary | ICD-10-CM | POA: Diagnosis present

## 2021-07-08 DIAGNOSIS — E0590 Thyrotoxicosis, unspecified without thyrotoxic crisis or storm: Secondary | ICD-10-CM | POA: Diagnosis present

## 2021-07-08 DIAGNOSIS — R911 Solitary pulmonary nodule: Secondary | ICD-10-CM | POA: Diagnosis present

## 2021-07-08 DIAGNOSIS — I6523 Occlusion and stenosis of bilateral carotid arteries: Secondary | ICD-10-CM | POA: Diagnosis not present

## 2021-07-08 DIAGNOSIS — G9389 Other specified disorders of brain: Secondary | ICD-10-CM | POA: Diagnosis not present

## 2021-07-08 DIAGNOSIS — Z882 Allergy status to sulfonamides status: Secondary | ICD-10-CM

## 2021-07-08 DIAGNOSIS — J449 Chronic obstructive pulmonary disease, unspecified: Secondary | ICD-10-CM | POA: Diagnosis not present

## 2021-07-08 DIAGNOSIS — Z7951 Long term (current) use of inhaled steroids: Secondary | ICD-10-CM | POA: Diagnosis not present

## 2021-07-08 DIAGNOSIS — Z8673 Personal history of transient ischemic attack (TIA), and cerebral infarction without residual deficits: Secondary | ICD-10-CM

## 2021-07-08 DIAGNOSIS — I6300 Cerebral infarction due to thrombosis of unspecified precerebral artery: Secondary | ICD-10-CM

## 2021-07-08 DIAGNOSIS — E785 Hyperlipidemia, unspecified: Secondary | ICD-10-CM | POA: Diagnosis present

## 2021-07-08 DIAGNOSIS — I700 Atherosclerosis of aorta: Secondary | ICD-10-CM | POA: Diagnosis not present

## 2021-07-08 DIAGNOSIS — I63521 Cerebral infarction due to unspecified occlusion or stenosis of right anterior cerebral artery: Secondary | ICD-10-CM | POA: Diagnosis present

## 2021-07-08 DIAGNOSIS — Z880 Allergy status to penicillin: Secondary | ICD-10-CM

## 2021-07-08 DIAGNOSIS — I6389 Other cerebral infarction: Secondary | ICD-10-CM | POA: Diagnosis not present

## 2021-07-08 DIAGNOSIS — R531 Weakness: Secondary | ICD-10-CM | POA: Diagnosis not present

## 2021-07-08 DIAGNOSIS — I10 Essential (primary) hypertension: Secondary | ICD-10-CM | POA: Diagnosis present

## 2021-07-08 DIAGNOSIS — I6502 Occlusion and stenosis of left vertebral artery: Secondary | ICD-10-CM | POA: Diagnosis not present

## 2021-07-08 DIAGNOSIS — R200 Anesthesia of skin: Secondary | ICD-10-CM | POA: Diagnosis not present

## 2021-07-08 DIAGNOSIS — I63421 Cerebral infarction due to embolism of right anterior cerebral artery: Secondary | ICD-10-CM | POA: Diagnosis not present

## 2021-07-08 HISTORY — DX: Chronic obstructive pulmonary disease, unspecified: J44.9

## 2021-07-08 HISTORY — DX: Tachycardia, unspecified: R00.0

## 2021-07-08 HISTORY — DX: Cerebral infarction, unspecified: I63.9

## 2021-07-08 LAB — DIFFERENTIAL
Abs Immature Granulocytes: 0.02 10*3/uL (ref 0.00–0.07)
Basophils Absolute: 0.1 10*3/uL (ref 0.0–0.1)
Basophils Relative: 1 %
Eosinophils Absolute: 0.1 10*3/uL (ref 0.0–0.5)
Eosinophils Relative: 2 %
Immature Granulocytes: 0 %
Lymphocytes Relative: 29 %
Lymphs Abs: 2.2 10*3/uL (ref 0.7–4.0)
Monocytes Absolute: 0.8 10*3/uL (ref 0.1–1.0)
Monocytes Relative: 10 %
Neutro Abs: 4.5 10*3/uL (ref 1.7–7.7)
Neutrophils Relative %: 58 %

## 2021-07-08 LAB — COMPREHENSIVE METABOLIC PANEL
ALT: 18 U/L (ref 0–44)
AST: 18 U/L (ref 15–41)
Albumin: 3.3 g/dL — ABNORMAL LOW (ref 3.5–5.0)
Alkaline Phosphatase: 63 U/L (ref 38–126)
Anion gap: 7 (ref 5–15)
BUN: 15 mg/dL (ref 6–20)
CO2: 25 mmol/L (ref 22–32)
Calcium: 8.7 mg/dL — ABNORMAL LOW (ref 8.9–10.3)
Chloride: 106 mmol/L (ref 98–111)
Creatinine, Ser: 1.01 mg/dL (ref 0.61–1.24)
GFR, Estimated: >60 mL/min (ref >60)
Glucose, Bld: 113 mg/dL — ABNORMAL HIGH (ref 70–99)
Potassium: 3.8 mmol/L (ref 3.5–5.1)
Sodium: 138 mmol/L (ref 135–145)
Total Bilirubin: 0.7 mg/dL (ref 0.3–1.2)
Total Protein: 6.1 g/dL — ABNORMAL LOW (ref 6.5–8.1)

## 2021-07-08 LAB — CBC
HCT: 45.7 % (ref 39.0–52.0)
Hemoglobin: 15.2 g/dL (ref 13.0–17.0)
MCH: 30.2 pg (ref 26.0–34.0)
MCHC: 33.3 g/dL (ref 30.0–36.0)
MCV: 90.9 fL (ref 80.0–100.0)
Platelets: 195 10*3/uL (ref 150–400)
RBC: 5.03 MIL/uL (ref 4.22–5.81)
RDW: 13.6 % (ref 11.5–15.5)
WBC: 7.8 10*3/uL (ref 4.0–10.5)
nRBC: 0 % (ref 0.0–0.2)

## 2021-07-08 LAB — LIPID PANEL
Cholesterol: 185 mg/dL (ref 0–200)
HDL: 33 mg/dL — ABNORMAL LOW (ref >40)
LDL Cholesterol: 138 mg/dL — ABNORMAL HIGH (ref 0–99)
Total CHOL/HDL Ratio: 5.6 RATIO
Triglycerides: 72 mg/dL (ref <150)
VLDL: 14 mg/dL (ref 0–40)

## 2021-07-08 LAB — APTT: aPTT: 29 seconds (ref 24–36)

## 2021-07-08 LAB — HIV ANTIBODY (ROUTINE TESTING W REFLEX): HIV Screen 4th Generation wRfx: NONREACTIVE

## 2021-07-08 LAB — SARS CORONAVIRUS 2 (TAT 6-24 HRS): SARS Coronavirus 2: NEGATIVE

## 2021-07-08 LAB — I-STAT CHEM 8, ED
BUN: 17 mg/dL (ref 6–20)
Calcium, Ion: 1.13 mmol/L — ABNORMAL LOW (ref 1.15–1.40)
Chloride: 104 mmol/L (ref 98–111)
Creatinine, Ser: 0.9 mg/dL (ref 0.61–1.24)
Glucose, Bld: 106 mg/dL — ABNORMAL HIGH (ref 70–99)
HCT: 45 % (ref 39.0–52.0)
Hemoglobin: 15.3 g/dL (ref 13.0–17.0)
Potassium: 4 mmol/L (ref 3.5–5.1)
Sodium: 141 mmol/L (ref 135–145)
TCO2: 28 mmol/L (ref 22–32)

## 2021-07-08 LAB — HEMOGLOBIN A1C
Hgb A1c MFr Bld: 5.6 % (ref 4.8–5.6)
Mean Plasma Glucose: 114.02 mg/dL

## 2021-07-08 LAB — PROTIME-INR
INR: 1 (ref 0.8–1.2)
Prothrombin Time: 13.4 seconds (ref 11.4–15.2)

## 2021-07-08 LAB — CBG MONITORING, ED
Glucose-Capillary: 109 mg/dL — ABNORMAL HIGH (ref 70–99)
Glucose-Capillary: 95 mg/dL (ref 70–99)

## 2021-07-08 MED ORDER — CLOPIDOGREL BISULFATE 300 MG PO TABS
300.0000 mg | ORAL_TABLET | Freq: Once | ORAL | Status: AC
  Administered 2021-07-08: 300 mg via ORAL
  Filled 2021-07-08: qty 1

## 2021-07-08 MED ORDER — ACETAMINOPHEN 160 MG/5ML PO SOLN: 650.0000 mg | ORAL | Status: DC | PRN

## 2021-07-08 MED ORDER — ASPIRIN EC 81 MG PO TBEC
81.0000 mg | DELAYED_RELEASE_TABLET | Freq: Every day | ORAL | Status: DC
  Administered 2021-07-09 – 2021-07-11 (×3): 81 mg via ORAL
  Filled 2021-07-08 (×4): qty 1

## 2021-07-08 MED ORDER — NICOTINE 21 MG/24HR TD PT24
21.0000 mg | MEDICATED_PATCH | Freq: Every day | TRANSDERMAL | Status: DC
  Administered 2021-07-08 – 2021-07-11 (×4): 21 mg via TRANSDERMAL
  Filled 2021-07-08 (×4): qty 1

## 2021-07-08 MED ORDER — ENOXAPARIN SODIUM 40 MG/0.4ML IJ SOSY
40.0000 mg | PREFILLED_SYRINGE | INTRAMUSCULAR | Status: DC
  Administered 2021-07-08 – 2021-07-10 (×3): 40 mg via SUBCUTANEOUS
  Filled 2021-07-08 (×3): qty 0.4

## 2021-07-08 MED ORDER — ACETAMINOPHEN 650 MG RE SUPP: 650.0000 mg | RECTAL | Status: DC | PRN

## 2021-07-08 MED ORDER — ACETAMINOPHEN 325 MG PO TABS
650.0000 mg | ORAL_TABLET | ORAL | Status: DC | PRN
  Administered 2021-07-08 – 2021-07-11 (×6): 650 mg via ORAL
  Filled 2021-07-08 (×6): qty 2

## 2021-07-08 MED ORDER — IOHEXOL 350 MG/ML SOLN
100.0000 mL | Freq: Once | INTRAVENOUS | Status: AC | PRN
  Administered 2021-07-08: 100 mL via INTRAVENOUS

## 2021-07-08 MED ORDER — METHIMAZOLE 5 MG PO TABS
5.0000 mg | ORAL_TABLET | Freq: Every day | ORAL | Status: DC
  Administered 2021-07-09 – 2021-07-11 (×3): 5 mg via ORAL
  Filled 2021-07-08 (×4): qty 1

## 2021-07-08 MED ORDER — ALBUTEROL SULFATE (2.5 MG/3ML) 0.083% IN NEBU: 2.5000 mg | INHALATION_SOLUTION | Freq: Four times a day (QID) | RESPIRATORY_TRACT | Status: DC | PRN

## 2021-07-08 MED ORDER — SENNOSIDES-DOCUSATE SODIUM 8.6-50 MG PO TABS: 1.0000 | ORAL_TABLET | Freq: Every evening | ORAL | Status: DC | PRN

## 2021-07-08 MED ORDER — SODIUM CHLORIDE 0.9% FLUSH
3.0000 mL | Freq: Once | INTRAVENOUS | Status: AC
Start: 2021-07-08 — End: 2021-07-08
  Administered 2021-07-08: 3 mL via INTRAVENOUS

## 2021-07-08 MED ORDER — STROKE: EARLY STAGES OF RECOVERY BOOK
Freq: Once | Status: DC
  Filled 2021-07-08: qty 1

## 2021-07-08 MED ORDER — ASPIRIN 325 MG PO TABS
325.0000 mg | ORAL_TABLET | Freq: Once | ORAL | Status: AC
  Administered 2021-07-08: 325 mg via ORAL
  Filled 2021-07-08: qty 1

## 2021-07-08 MED ORDER — CLOPIDOGREL BISULFATE 75 MG PO TABS
75.0000 mg | ORAL_TABLET | Freq: Every day | ORAL | Status: DC
  Administered 2021-07-09 – 2021-07-11 (×3): 75 mg via ORAL
  Filled 2021-07-08 (×4): qty 1

## 2021-07-08 NOTE — ED Notes (Addendum)
Patient placed in room from CT

## 2021-07-08 NOTE — ED Provider Notes (Signed)
[facility] EMERGENCY DEPARTMENT Provider Note   CSN: [id number] Arrival date & time: 07/08/21  2094     History No chief complaint on file.   Julian Marten. is a 60 y.o. male.  HPI  60 year old male presents the emergency department as a code stroke.  Patient evaluated at EMS triage with the neuro team at bedside for code stroke.  Reports that patient went to bed around midnight at baseline.  He awoke this morning around 8 AM and was noted to have left-sided weakness, neglect and facial droop.  Patient denies being on any anticoagulation.  History limited secondary to acuity.  No past medical history on file.  There are no problems to display for this patient.    The histories are not reviewed yet. Please review them in the "History" navigator section and refresh this [place].     No family history on file.     Home Medications Prior to Admission medications   Not on File    Allergies    Patient has no allergy information on record.  Review of Systems   Review of Systems  Unable to perform ROS: Acuity of condition   Physical Exam Updated Vital Signs Wt 70.8 kg   Physical Exam Vitals and nursing note reviewed.  Constitutional:      Appearance: Normal appearance.  HENT:     Head: Normocephalic.     Mouth/Throat:     Mouth: Mucous membranes are moist.  Eyes:     Pupils: Pupils are equal, round, and reactive to light.  Cardiovascular:     Rate and Rhythm: Normal rate.  Pulmonary:     Effort: Pulmonary effort is normal. No respiratory distress.     Comments: Protecting his airway Skin:    General: Skin is warm.  Neurological:     Mental Status: He is alert.     Comments: Initial NIH greater than 15, neglecting the left side, gaze deviation, left-sided weakness, speech and mentation intact    ED Results / Procedures / Treatments   Labs (all labs ordered are listed, but only abnormal results are displayed) Labs Reviewed  I-STAT  CHEM 8, ED - Abnormal; Notable for the following components:      Result Value   Glucose, Bld 106 (*)    Calcium, Ion 1.13 (*)    All other components within normal limits  CBG MONITORING, ED - Abnormal; Notable for the following components:   Glucose-Capillary 109 (*)    All other components within normal limits  CBC  DIFFERENTIAL  PROTIME-INR  APTT  COMPREHENSIVE METABOLIC PANEL    EKG None  Radiology No results found.  Procedures .Critical Care Performed by: Lorelle Gibbs, DO Authorized by: Lorelle Gibbs, DO   Critical care provider statement:    Critical care time (minutes):  45   Critical care time was exclusive of:  Separately billable procedures and treating other patients   Critical care was necessary to treat or prevent imminent or life-threatening deterioration of the following conditions:  CNS failure or compromise   Critical care was time spent personally by me on the following activities:  Discussions with consultants, evaluation of patient's response to treatment, examination of patient, ordering and performing treatments and interventions, ordering and review of laboratory studies, ordering and review of radiographic studies, pulse oximetry, re-evaluation of patient's condition, obtaining history from patient or surrogate and review of old charts   Medications Ordered in ED Medications  sodium chloride  flush (NS) 0.9 % injection 3 mL (has no administration in time range)    ED Course  I have reviewed the triage vital signs and the nursing notes.  Pertinent labs & imaging results that were available during my care of the patient were reviewed by me and considered in my medical decision making (see chart for details).    MDM Rules/Calculators/A&P                           60 year old male presents the emergency department as a code stroke.  Evaluated at EMS triage with neuro team at bedside.  Initial NIH is large greater than 15 with left-sided  neglect and weakness.  Patient is not on any anticoagulation, he is out of the window for tPA.  Initial head CT without shows old stroke findings but no acute bleed.  Plan for vascular imaging and hopeful endovascular treatment.  CT perfusion study shows a distal cut off of the right ACA, not amendable to endovascular treatment.  Plan for medical admission and stroke work-up.  NIH is unchanged.  Vitals are stable at time of admission.  Patients evaluation and results requires admission for further treatment and care. Patient agrees with admission plan, offers no new complaints and is stable/unchanged at time of admit.  Final Clinical Impression(s) / ED Diagnoses Final diagnoses:  None    Rx / DC Orders ED Discharge Orders     None        Lorelle Gibbs, DO 07/08/21 1215

## 2021-07-08 NOTE — ED Notes (Signed)
EEG in progress 

## 2021-07-08 NOTE — Progress Notes (Signed)
Received pt from the ED, alert and oriented, oriented to the room, call light in reach, placed on tele and verified, MD orders implemented  07/08/21 2022  Vitals  Temp 97.7 F (36.5 C)  Temp Source Oral  BP (!) 142/69  MAP (mmHg) 91  BP Location Left Arm  BP Method Automatic  Patient Position (if appropriate) Lying  Pulse Rate 68  Pulse Rate Source Dinamap  Resp 18  MEWS COLOR  MEWS Score Color Green  Oxygen Therapy  SpO2 95 %  O2 Device Room Air  Height and Weight  Height 5\' 11"  (1.803 m)  Weight 74.2 kg  BSA (Calculated - sq m) 1.93 sq meters  BMI (Calculated) 22.83  Weight in (lb) to have BMI = 25 178.9  MEWS Score  MEWS Temp 0  MEWS Systolic 0  MEWS Pulse 0  MEWS RR 0  MEWS LOC 0  MEWS Score 0

## 2021-07-08 NOTE — ED Notes (Signed)
Provider at bedside

## 2021-07-08 NOTE — Procedures (Signed)
Routine EEG Report  Julian Dunlap. is a 60 y.o. male with a history of stroke who is undergoing an EEG to evaluate for seizures.  Report: This EEG was acquired with electrodes placed according to the International 10-20 electrode system (including Fp1, Fp2, F3, F4, C3, C4, P3, P4, O1, O2, T3, T4, T5, T6, A1, A2, Fz, Cz, Pz). The following electrodes were missing or displaced: none.  The occipital dominant rhythm was 8.5 Hz. This activity is reactive to stimulation. Drowsiness was manifested by background fragmentation; deeper stages of sleep were identified by K complexes and sleep spindles. There was no focal slowing. There were no interictal epileptiform discharges. There were no electrographic seizures identified. Photic stimulation and hyperventilation were not performed.  Impression and clinical correlation: This EEG was obtained while awake and asleep and is normal. Multiple button pushes for fine tremors of his right lower extremity did not have ictal correlate on EEG. The activity was too subtle to be appreciated over video. Focal motor seizures may involve too small a cortical area to be consistently picked up by scalp EEG, therefore consider prolonged EEG if there is high suspicion for clinical seizures.  Julian Monks, MD [facility] [phone]  If 7pm- 7am, please page neurology on call as listed in [place].

## 2021-07-08 NOTE — ED Notes (Signed)
Patient transported to MRI 

## 2021-07-08 NOTE — Code Documentation (Signed)
Stroke Response Nurse Documentation Code Julian Allshouse Jr. is a 60 y.o. male arriving to [facility] ED via [facility] on 07/08/21 with past medical hx of hypertension, COPD, smokes 2-3 packs per day, hyperlipidemia and hyperthyroidism.  Patient from home where he was LKW at when he went to bed at midnight and now complaining of inability to feel left side upon waking up. EMS also left side weakness and facial droop. Code stroke was activated by EMS.  Stroke team at the bedside on patient arrival. Labs drawn and patient cleared for CT by Dr. Dr. Dina Rich. Patient to CT with team. NIHSS 18, see documentation for details and code stroke times. Patient with right gaze preference  and left facial droop, disoriented to month, left arm/leg flaccid and right leg weakness, numbness on left with neglect on exam.   The following imaging was completed: CT, CTA head and neck. Patient is not a candidate for IV Thrombolytic due to out of the window. Patient is not a candidate for IR due to no LVO seen on scans.   Care/Plan: Q2h VS/neuro checks x 12h, then Q4h. Stroke workup.  Bedside handoff with ED RN Katharine Look.    Earma Reading  Stroke Response RN

## 2021-07-08 NOTE — Progress Notes (Signed)
EEG complete - results pending 

## 2021-07-08 NOTE — Consult Note (Addendum)
Neurology Consultation  Reason for Consult: Left-sided weakness, right gaze Referring Physician: Dr. Dina Rich  CC: Left-sided weakness  History is obtained from: Patient, EMS, chart review  HPI: Julian Dunlap. is a 60 y.o. male with a medical history significant for hyperthyroidism, COPD, essential hypertension, and tobacco use who currently smokes 2-3 packs per day. Patient presented to the ED 07/08/2021 via EMS for evaluation of left-sided weakness and right-sided gaze. He states that prior to bed last night at around midnight he was walking around and feeling fine but when he woke this morning he was unable to move his left side. He does state that he had previously been on a medication to lower his heart rate at night to help him sleep and breath better and that he has recently been taken off of this and has not felt well since but he is unable to recall the medication. He has recently had his methimazole medication refill sent to his pharmacy on 07/04/2021. He does endorse that he has a history of right sided head trauma with seizures in the past but denies continued seizures or a recent history of seizures. There are no supporting documents in [place] for review regarding reported head trauma or history of seizures.   LKW: 0000 TNK given?: no, patient is well outside of the thrombolytic therapy window IR Thrombectomy? No, vessel imaging reviewed by neurologist and neurointerventional radiologist without imaging evidence of LVO. Modified Rankin Scale: 0-Completely asymptomatic and back to baseline post- stroke  ROS: A complete ROS was performed and is negative except as noted in the HPI.   No past medical history on file. Medical History: Hyperthyroidism Hypertension COPD Reported history of head trauma with history of seizures without recurrent seizures  No family history on file.  Social History:   has no history on file for tobacco use, alcohol use, and drug use. Tobacco  use > 50 pack year smoking history  Medications  Current Facility-Administered Medications:    sodium chloride flush (NS) 0.9 % injection 3 mL, 3 mL, Intravenous, Once, Horton, Kristie M, DO No current outpatient medications on file.  No current outpatient medications  Exam: Current vital signs: BP (!) 150/79   Pulse 65   Resp 17   Wt 70.8 kg   SpO2 98%  Vital signs in last 24 hours: Pulse Rate:  [65] 65 (09/23 0932) Resp:  [17] 17 (09/23 0932) BP: (150)/(79) 150/79 (09/23 0932) SpO2:  [98 %] 98 % (09/23 0932) Weight:  [70.8 kg] 70.8 kg (09/23 0900)  GENERAL: Awake, alert, in no acute distress Psych: Affect appropriate for situation, patient is calm and cooperative with examination Head: Normocephalic and atraumatic, without obvious abnormality EENT: Normal conjunctivae, dry mucous membranes, no OP obstruction LUNGS: Normal respiratory effort. Non-labored breathing on room air CV: Regular rate and rhythm on telemetry ABDOMEN: Soft, non-tender, non-distended Extremities: warm, well perfused, without obvious deformity  NEURO:  Mental Status: Awake, alert, and oriented to person, place, and situation. He is able to correctly state his age and year but is not able to correctly state the month He is able to provide a clear and coherent history of present illness. Speech/Language: speech is mildly dysarthric.  Naming, repetition, fluency, and comprehension intact without aphasia. He does have some left-sided neglect.  Cranial Nerves:  II: PERRL 3 mm/brisk with partial left visual field deficit.   III, IV, VI: Patient has a right gaze preference without forced deviation. He is able to reach midline and intermittently minimally  cross midline but is unable to achieve full leftward gaze.  V: Sensation is decreased on the left face compared to the right  VII: There is mild left mouth droop though assessment is limited with minimal facial movement to command throughout.    VIII:  Hearing is intact to voice IX, X: Palate elevation is symmetric. Phonation normal.  XI: Unable to shrug shoulder on the left.  XII: Tongue protrudes midline without fasciculations.   Motor: 5/5 strength on the right upper extremity. Right lower extremity drifts to the bed immediately during assessment without antigravity movement but he is able to move minimally without gravity.  Left upper and lower extremities are flaccid without movement. Tone is normal on the right, flaccid on the left. Bulk is normal.  Sensation: Patient unreliably reports sensation to light touch, he states that the left is tingling compared to the right and that he can feel "something" but does not know what. At other times, he states that light touch is the same throughout with extensive decreased sensation and tingling. Initially, he does not react to noxious stimuli in the left upper extremity but yells with noxious stimuli on the right upper extremity. Extinction to DSS present on the left.   Coordination: FTN unable to be assessed on the left due to LUE flaccid, on the right his movements are bradykinetic with past pointing. HKS unable to be assessed due to bilateral lower extremity weakness.  DTRs: 3+ and symmetric throughout Gait: Deferred  NIHSS: 1a Level of Conscious.: 0 1b LOC Questions: 1 1c LOC Commands: 0 2 Best Gaze: 1 3 Visual: 1 4 Facial Palsy: 1 5a Motor Arm - left: 4 5b Motor Arm - Right: 0 6a Motor Leg - Left: 4 6b Motor Leg - Right: 3 7 Limb Ataxia: 0 8 Sensory: 1 9 Best Language: 0 10 Dysarthria: 1 11 Extinct. and Inatten.: 1 TOTAL: 18  Labs I have reviewed labs in epic and the results pertinent to this consultation are: CBC    Component Value Date/Time   WBC 7.8 07/08/2021 0905   RBC 5.03 07/08/2021 0905   HGB 15.3 07/08/2021 0907   HCT 45.0 07/08/2021 0907   PLT 195 07/08/2021 0905   MCV 90.9 07/08/2021 0905   MCH 30.2 07/08/2021 0905   MCHC 33.3 07/08/2021 0905   RDW 13.6  07/08/2021 0905   LYMPHSABS 2.2 07/08/2021 0905   MONOABS 0.8 07/08/2021 0905   EOSABS 0.1 07/08/2021 0905   BASOSABS 0.1 07/08/2021 0905   CMP     Component Value Date/Time   NA 141 07/08/2021 0907   K 4.0 07/08/2021 0907   CL 104 07/08/2021 0907   GLUCOSE 106 (H) 07/08/2021 0907   BUN 17 07/08/2021 0907   CREATININE 0.90 07/08/2021 0907   Lipid Panel  No results found for: CHOL, TRIG, HDL, CHOLHDL, VLDL, LDLCALC, LDLDIRECT No results found for: HGBA1C  Imaging I have reviewed the images obtained:  CT-scan of the brain 07/08/2021: 1. No evidence of acute large vascular territory infarct or acute hemorrhage. ASPECTS is 10. 2. Remote right parietal infarct.  CT angio head and neck with cerebral perfusion 07/08/2021:  IMPRESSION: 1. Occlusion of the distal right ACA, A4 segment. 2. Approximately 15 mL of core infarct in the correlate distal right ACA territory with 15 mL of surrounding penumbra. 3. Left vertebral artery is occluded at its origin with irregular reconstitution in the upper neck from collaterals. The right vertebral artery appears to largely terminate as PICA with small  vertebrobasilar system and prominent bilateral posterior communicating arteries. 4. Irregular nodularity in the right lung apex, measuring up to approximately 1.2 cm. Differential considerations include nodular scarring versus malignancy (particularly given the patient's extensive emphysema). Recommend dedicated chest CT when feasible two fully evaluate the chest.  Assessment: 60 y.o. male who presented from home via EMS for evaluation of left-sided weakness and right-sided gaze with some left-sided neglect. Last known well was prior to bed at 00:00 this morning. - Examination reveals patient with left hemiparesis, right lower extremity weakness, right gaze preference, mild dysarthria, and mild left facial droop. His initial NIHSS is 18.  - CT imaging reviewed by neurologist and  neurointerventional radiologist without evidence of an acute LVO.  - Patient arrived outside of the thrombolytic therapy window for TNK and was not a candidate for IR thrombectomy without vessel imaging concerning for LVO.  - Presentation is consistent with an acute ischemic stroke within a vascular territory affecting a remote parietal infarct. Patient denies history of stroke or stroke-like symptoms. There is low suspicion for seizure with Todd's paralysis with presentation of BLE weakness, left hemiparesis, and right gaze, however, patient reports a history of right-sided head trauma and seizures without seizure recurrence. Will obtain EEG for further evaluation.   Impression: Acute ischemic stroke  Remote right parietal infarct  Recommendations: - Hospitalist admission for stroke work up - Stroke labs: HgbA1c, fasting lipid panel. Statin therapy as needed for a goal LDL of < 70 - Frequent neuro checks - Permissive hypertension for 48 hours s/p symptom onset, treat SBP > 220 - MRI brain wo contrast -DAPT - Echocardiogram - Risk factor modification, smoking cessation counseling - Telemetry monitoring - PT consult, OT consult, Speech consult - Stroke team to follow - Will obtain routine EEG   Anibal Henderson, AGAC-NP [facility] Pager: [phone]  Attending addendum Patient seen as a code stroke. Agree with history and physical above High NIH-concerning for right hemispheric acute stroke. Outside the window for IV tPA given last known well at midnight Vessel imaging reviewed personally along with neuroradiologist and neuro interventionalist-possible distal ACA occlusion but no proximal vessel to go after.  CT perfusion shows 15 cc core, total 30 cc area at risk with 15 cc penumbra.  This correlates with the distal A1 territory. Will need stroke work-up as above. In addition to the stroke work-up, also obtain EEG. Discussed in detail with the neuroradiologist, neuro  interventional list and ED providers. -- Amie Portland, MD Neurologist [facility] Pager: [phone]  CRITICAL CARE ATTESTATION Performed by: Amie Portland, MD Total critical care time: 70 minutes Critical care time was exclusive of separately billable procedures and treating other patients and/or supervising APPs/Residents/Students Critical care was necessary to treat or prevent imminent or life-threatening deterioration due to acute ischemic stroke. This patient is critically ill and at significant risk for neurological worsening and/or death and care requires constant monitoring. Critical care was time spent personally by me on the following activities: development of treatment plan with patient and/or surrogate as well as nursing, discussions with consultants, evaluation of patient's response to treatment, examination of patient, obtaining history from patient or surrogate, ordering and performing treatments and interventions, ordering and review of laboratory studies, ordering and review of radiographic studies, pulse oximetry, re-evaluation of patient's condition, participation in multidisciplinary rounds and medical decision making of high complexity in the care of this patient.

## 2021-07-08 NOTE — ED Notes (Signed)
Patient transported to CT 

## 2021-07-08 NOTE — ED Triage Notes (Signed)
LKW @ [facility], woke up with no feeling left side, called EMS, A/Ox4, Hx COPD, fast HR per patient report.

## 2021-07-08 NOTE — H&P (Signed)
Date: 07/08/2021               Patient Name:  Julian Dunlap. MRN: [id number]  DOB: 15-Feb-1961 Age / Sex: 60 y.o., male   PCP: No primary care provider on file.         Medical Service: Internal Medicine Teaching Service         Attending Physician: Dr. Sid Falcon, MD;Nare*    First Contact: Julian Fuchs, DO Pager: KM [phone]  Second Contact: Hadassah Pais, MD Pager: Rudean Curt [phone]       After Hours (After 5p/  First Contact Pager: [phone]  weekends / holidays): Second Contact Pager: [phone]   SUBJECTIVE   Chief Complaint: Left-sided weakness  History of Present Illness: Julian Dunlap. is a 60 year old male living with HLD ,HTN, hyperthyroidism on methimazole , COPD, and  tobacco use disorder who presents with left-sided weakness.   Patient was in his normal state of health until this morning when he says his dog woke him up because he was shaking. He did not continue to shake, but felt confused. He also had left-sided numbness and weakness. He was able to contact his sister by telephone , but estimates this took and hour to complete. His sister contacted EMS who brought the patient in as a code stroke.  On arrival his NIHSS 18.  Noted to have right gaze preference and left facial droop, disoriented to month, left arm/leg flaccid and right leg weakness, numbness on the left with neglect on exam.   Review of Systems  Constitutional:  Negative for chills and fever.  Eyes:  Negative for double vision and photophobia.  Respiratory:  Negative for cough and shortness of breath.   Cardiovascular:  Negative for chest pain and leg swelling.  Gastrointestinal:  Negative for abdominal pain, constipation and diarrhea.  Genitourinary:  Positive for urgency. Negative for dysuria and frequency.  Musculoskeletal:  Negative for falls and joint pain.  Skin:  Negative for itching and rash.  Neurological:  Positive for focal weakness and headaches.  Psychiatric/Behavioral:  Negative for  depression and substance abuse.      Meds:  Medications listed on care everywhere below, patient unable to confirm. Requested medication list from PCP at [facility].  Amitriptyline 25 mg tablet Symbicort 160-4.5 MCG Breo Ellipta 100-25 MCG Amlodipine 5 mg daily Methimazole 5 mg daily Pravastatin 20 mg daily   Past Medical History:  Diagnosis Date   COPD (chronic obstructive pulmonary disease) ([place])    Heart rate fast    Stroke [facility])     Past Surgical History:  Procedure Laterality Date   CHOLECYSTECTOMY      Social:  Lives alone. Not currently employed and reports no prior jobs. His sister is bedside and is who he turns to for support. He was performing his own ADLs and IADLs on admission. His PCP is Julian Number, PA at [facility]. He has smoke 2 ppd and has smoked since being a teenager with a few episodes of quitting briefly. No current alcohol or drug use.    Family History:  Family History  Problem Relation Age of Onset   Hyperthyroidism Mother    Aneurysm Father 46       Heart aneurysm - unsure if this is coronary aneurysm or aortic aneurysm     Allergies: Allergies as of 07/08/2021 - Review Complete 07/08/2021  Allergen Reaction Noted   Sulfa antibiotics Other (See Comments) 07/08/2021   Penicillins Rash 07/08/2021  Review of Systems: A complete ROS was negative except as per HPI.   OBJECTIVE:   Physical Exam: Blood pressure (!) 145/74, pulse 68, resp. rate 17, weight 70.8 kg, SpO2 97 %.   General:  Patient laughing with sister who is bedside, nl appearance, holding his head intermediately on right side and reports headache HE: Normocephalic, atraumatic , Conjunctivae normal ENT: No congestion, no rhinorrhea, no exudate or erythema  Cardiovascular: Normal rate, regular rhythm.  No murmurs, rubs, or gallops Pulmonary : Effort normal, breath sounds normal. No wheezes, rales, or rhonchi Abdominal: soft, nontender,  bowel  sounds present Musculoskeletal: no swelling , deformity, injury  Skin: Warm, dry , no bruising Psychiatric/Behavioral:  normal mood, normal behavior  Mental Status: Patient is awake, alert, oriented x4 No signs of aphasia or neglect Cranial Nerves: II: Pupils equal, round, and reactive to light.   III,IV, VI: EOMI without ptosis or diploplia.  V: Facial sensation is decreased on left to light touch VII: Facial movement is symmetric.  VIII: hearing is intact to voice X: Uvula elevates symmetrically XI: unable to shrug left shoulder, weakness with turning head to left against my hand XII: tongue is midline without atrophy or fasciculations.  Motor: 5/5 right UE, 4/5 proximal and distal left upper ext 4/5 right lower extremity , 4/5 left lower extremitiy  Sensory: Sensation decreased on left upper extremity and lower extremity to light touch Deep Tendon Reflexes: 3+ and symmetric  Cerebellar: Finger-Nose limited on left by weakness but able to perform, right finger to nose nl.    ASSESSMENT & PLAN:    Assessment & Plan by Problem: Active Problems:   CVA (cerebral vascular accident) ([place])   Karen Chafe [name]. is a 60 y.o. living with HLD ,HTN, hyperthyroidism on methimazole , COPD, and  tobacco use disorde who presented with left-sided weakness was found to have acute ischemic stroke.  #Acute ischemic stroke Acute ischemic stroke involving distal A1 territory.  Patient seen by neurology as code stroke and stroke team following.  Patient was outside of IV tPA window and no LVO to target. Will treat with DAPT x21, then ASA. Patient risk factors include tobacco use, HTN, and HLD.  #Transthoracic Echo  # BP goal: permissive HTN upto 220/120 mmHg, hold home amlodipine 5 mg .  # HBAIC  # Lipid profile, target LDL < 70 # Telemetry monitoring # Frequent neuro checks # EEG # PT, OT , SLP # NPO until passes stroke swallow screen   #Hyperthyroidism - Labs checked last week by  endocrinologist , continue Methimazole.   #COPD - Patient asymptomatic. Report having stroke has taken the cravings away for tobacco.  - Albuterol PRN  Diet: NPO VTE: Enoxaparin IVF: None,None Code: Full  Prior to Admission Living Arrangement: Home Anticipated Discharge Location: TBD Barriers to Discharge: Stroke workup  Dispo: Admit patient to Inpatient with expected length of stay greater than 2 midnights.  Signed: Drema Pry, MD Internal Medicine Resident PGY-3 Pager: [phone]  07/08/2021, 1:31 PM

## 2021-07-09 ENCOUNTER — Other Ambulatory Visit (HOSPITAL_COMMUNITY): Payer: Medicare Other

## 2021-07-09 ENCOUNTER — Inpatient Hospital Stay (HOSPITAL_COMMUNITY): Payer: Medicare Other

## 2021-07-09 DIAGNOSIS — I6389 Other cerebral infarction: Secondary | ICD-10-CM | POA: Diagnosis not present

## 2021-07-09 DIAGNOSIS — I63421 Cerebral infarction due to embolism of right anterior cerebral artery: Secondary | ICD-10-CM

## 2021-07-09 LAB — ECHOCARDIOGRAM COMPLETE
AR max vel: 4.96 cm2
AV Area VTI: 4.92 cm2
AV Area mean vel: 6.04 cm2
AV Mean grad: 1.3 mmHg
AV Peak grad: 3.6 mmHg
Ao pk vel: 0.94 m/s
Area-P 1/2: 3.07 cm2
Height: 71 in
S' Lateral: 4.1 cm
Weight: 2617.3 oz

## 2021-07-09 LAB — RAPID URINE DRUG SCREEN, HOSP PERFORMED
Amphetamines: NOT DETECTED
Barbiturates: NOT DETECTED
Benzodiazepines: NOT DETECTED
Cocaine: NOT DETECTED
Opiates: NOT DETECTED
Tetrahydrocannabinol: NOT DETECTED

## 2021-07-09 LAB — CBC
HCT: 47.2 % (ref 39.0–52.0)
Hemoglobin: 15.7 g/dL (ref 13.0–17.0)
MCH: 29.3 pg (ref 26.0–34.0)
MCHC: 33.3 g/dL (ref 30.0–36.0)
MCV: 88.2 fL (ref 80.0–100.0)
Platelets: 226 10*3/uL (ref 150–400)
RBC: 5.35 MIL/uL (ref 4.22–5.81)
RDW: 13.5 % (ref 11.5–15.5)
WBC: 9 10*3/uL (ref 4.0–10.5)
nRBC: 0 % (ref 0.0–0.2)

## 2021-07-09 LAB — BASIC METABOLIC PANEL
Anion gap: 9 (ref 5–15)
BUN: 10 mg/dL (ref 6–20)
CO2: 26 mmol/L (ref 22–32)
Calcium: 9.5 mg/dL (ref 8.9–10.3)
Chloride: 103 mmol/L (ref 98–111)
Creatinine, Ser: 0.98 mg/dL (ref 0.61–1.24)
GFR, Estimated: >60 mL/min (ref >60)
Glucose, Bld: 89 mg/dL (ref 70–99)
Potassium: 3.9 mmol/L (ref 3.5–5.1)
Sodium: 138 mmol/L (ref 135–145)

## 2021-07-09 MED ORDER — MELATONIN 3 MG PO TABS
3.0000 mg | ORAL_TABLET | Freq: Every day | ORAL | Status: DC
  Administered 2021-07-09 – 2021-07-10 (×3): 3 mg via ORAL
  Filled 2021-07-09 (×3): qty 1

## 2021-07-09 MED ORDER — ATORVASTATIN CALCIUM 80 MG PO TABS
80.0000 mg | ORAL_TABLET | Freq: Every day | ORAL | Status: DC
  Administered 2021-07-09 – 2021-07-11 (×3): 80 mg via ORAL
  Filled 2021-07-09 (×3): qty 1

## 2021-07-09 MED ORDER — PERFLUTREN LIPID MICROSPHERE
1.0000 mL | INTRAVENOUS | Status: AC | PRN
  Administered 2021-07-09: 2 mL via INTRAVENOUS
  Filled 2021-07-09: qty 10

## 2021-07-09 NOTE — Evaluation (Signed)
Speech Language Pathology Evaluation Patient Details Name: Julian Dunlap. MRN: [id number] DOB: 11/29/1960 Today's Date: 07/09/2021 Time: 0258-5277 SLP Time Calculation (min) (ACUTE ONLY): 15 min  Problem List:  Patient Active Problem List   Diagnosis Date Noted   CVA (cerebral vascular accident) ([place]) 07/08/2021   Past Medical History:  Past Medical History:  Diagnosis Date   COPD (chronic obstructive pulmonary disease) ([place])    Heart rate fast    Stroke [facility])    Past Surgical History:  Past Surgical History:  Procedure Laterality Date   CHOLECYSTECTOMY     HPI:  Julian Dunlap. is a 60 y.o. male who presensted to the ED 9/23 with left-sided weakness and right-sided gaze. Head CT (+) for occlusion of distal R ACA, Acute ischemic CVA of R ACA, L vertebral artery occlusion, irregular nodual in R lung apex. PMHx: hyperthyroidism, COPD, essential hypertension,  right sided head trauma from a fall many years ago with seizures and tobacco use who currently smokes 2-3 packs per day   Assessment / Plan / Recommendation Clinical Impression  Patient presents with mild-moderate cognitive impairment in areas of attention, memory, awareness, problem solving. Of note, he reported an 8 of 10 headache pain and that "my mind is somewhere else". He reported he is focused on being able to leave hospital and stay with his sister as she can take care of him and also she is caring for his dog. Patient was fully oriented and did demonstrate ability to recall and describe earlier PT session. He demonstrated some awareness to deficits during ambulation secondary to not having sense of what his left leg was doing when walking. He participated in cognitive testing via the SLUMS exam and received a score of 19. 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 Main errors on test were related to delayed recall of words and information from short  story. SLP is recommending ST eval and tx at next venue of care. (patient likely discharging home, but could benefit from CIR or SNF). No acute f/u indicated for ST.    SLP Assessment  SLP Recommendation/Assessment: All further Speech Lanaguage Pathology  needs can be addressed in the next venue of care SLP Visit Diagnosis: Cognitive communication deficit (R41.841)    Recommendations for follow up therapy are one component of a multi-disciplinary discharge planning process, led by the attending physician.  Recommendations may be updated based on patient status, additional functional criteria and insurance authorization.    Follow Up Recommendations  Home health SLP;Inpatient Rehab;Skilled Nursing facility;24 hour supervision/assistance    Frequency and Duration           SLP Evaluation Cognition  Overall Cognitive Status: No family/caregiver present to determine baseline cognitive functioning Arousal/Alertness: Awake/alert Orientation Level: Oriented to person;Oriented to place;Oriented to time;Oriented to situation Year: 2022 Month: September Day of Week: Correct Attention: Selective Selective Attention: Impaired Selective Attention Impairment: Verbal complex Memory: Impaired Memory Impairment: Storage deficit;Retrieval deficit;Other (comment) (recalled 1/5 words after 3 minute delay) Awareness: Impaired Awareness Impairment: Anticipatory impairment Problem Solving: Impaired Problem Solving Impairment: Verbal complex Behaviors: Restless;Impulsive Safety/Judgment: Impaired       Comprehension  Auditory Comprehension Overall Auditory Comprehension: Appears within functional limits for tasks assessed    Expression Expression Primary Mode of Expression: Verbal Verbal Expression Overall Verbal Expression: Appears within functional limits for tasks assessed Written Expression Dominant Hand: Right   Oral / Motor  Oral  Motor/Sensory Function Overall Oral Motor/Sensory  Function: Within functional limits Motor Speech Overall Motor Speech: Appears within functional limits for tasks assessed   GO                    Sonia Baller, MA, CCC-SLP Speech Therapy

## 2021-07-09 NOTE — Plan of Care (Signed)
  Problem: Education: Goal: Knowledge of General Education information will improve Description: Including pain rating scale, medication(s)/side effects and non-pharmacologic comfort measures Outcome: Progressing   Problem: Health Behavior/Discharge Planning: Goal: Ability to manage health-related needs will improve Outcome: Progressing   Problem: Clinical Measurements: Goal: Ability to maintain clinical measurements within normal limits will improve Outcome: Progressing Goal: Will remain free from infection Outcome: Progressing Goal: Diagnostic test results will improve Outcome: Progressing Goal: Respiratory complications will improve Outcome: Progressing Goal: Cardiovascular complication will be avoided Outcome: Progressing   Problem: Activity: Goal: Risk for activity intolerance will decrease Outcome: Progressing   Problem: Nutrition: Goal: Adequate nutrition will be maintained Outcome: Progressing   Problem: Coping: Goal: Level of anxiety will decrease Outcome: Progressing   Problem: Elimination: Goal: Will not experience complications related to bowel motility Outcome: Progressing Goal: Will not experience complications related to urinary retention Outcome: Progressing   Problem: Pain Managment: Goal: General experience of comfort will improve Outcome: Progressing   Problem: Safety: Goal: Ability to remain free from injury will improve Outcome: Progressing   Problem: Skin Integrity: Goal: Risk for impaired skin integrity will decrease Outcome: Progressing   Problem: Education: Goal: Knowledge of disease or condition will improve Outcome: Progressing Goal: Knowledge of secondary prevention will improve Outcome: Progressing Goal: Knowledge of patient specific risk factors addressed and post discharge goals established will improve Outcome: Progressing Goal: Individualized Educational Video(s) Outcome: Progressing   Problem: Health Behavior/Discharge  Planning: Goal: Ability to manage health-related needs will improve Outcome: Progressing   Problem: Self-Care: Goal: Ability to participate in self-care as condition permits will improve Outcome: Progressing   Problem: Ischemic Stroke/TIA Tissue Perfusion: Goal: Complications of ischemic stroke/TIA will be minimized Outcome: Progressing

## 2021-07-09 NOTE — Progress Notes (Addendum)
   Subjective:  HD1  Patient evaluated at bedside this AM. States he has some residual weakness on left, but otherwise feels ready to go home. Discussed smoking cessation, patient does wish to quit. He will follow-up with his PCP for this. In addition, discussed finding of pulmonary nodule on CT chest. Patient reports he previously knew about the nodule, and has been there for "years." Discussed plan today for PT/OT and Echo, possible discharge later.  Objective:  Vital signs in last 24 hours: Vitals:   07/08/21 2317 07/08/21 2348 07/09/21 0410 07/09/21 0855  BP: (!) 144/58 (!) 154/77 (!) 149/76 (!) 150/81  Pulse: 73 69 73 73  Resp: 19 18 19 20   Temp: 98.5 F (36.9 C) 98.3 F (36.8 C) 98.1 F (36.7 C) 97.8 F (36.6 C)  TempSrc: Oral Oral Oral Oral  SpO2: 95% 95% 94% 95%  Weight:      Height:       General: Resting comfortably in bed, no acute distress CV: Regular rate, rhythm. No murmurs appreciated. Warm extremities. Pulm: Normal work of breathing on room air. Clear to auscultation bilaterally GI: Abdomen soft, non-tender, non-distended. Normoactive bowel sounds. Neuro: Awake, alert, conversing appropriately. CN in tact. Motor 4/5 in LUE, LLE, 5/5 in RUE, RLE. Sensation in tact throughout. Psych: Normal mood, affect, speech.  Assessment/Plan:  Julian Dunlap is 60yo person with hyperlipidemia, hypertension, COPD, hyperthyroidism on methimazole, and tobacco use disorder admitted 9/23 for acute CVA, doing well today and anticipate discharge pending completion of stroke work-up.  Active Problems:   CVA (cerebral vascular accident) ([place])  #Acute ischemic CVA of R ACA #RF: tobacco use disorder, HTN, HLD Patient doing well this morning, continuing to have some residual left-sided weakness. EEG yesterday negative for epileptic waveforms. Otherwise work-up notable for LDL 135. Discussed with Julian Dunlap importance of statin medication, he is agreeable to try atorvastatin after he did  not tolerate pravastatin well in outpatient setting. In addition, discussed smoking cessation. He appears motivated to quit at this time. Encouraged him to continue working with his primary care physician for further assistance in this. Will need TTE today as well as PT/OT evaluation. Expect discharge later today unless there is an abnormality requiring further hospitalization. Appreciate neurology's assistance and recommendations. - TTE today - PT/OT evaluation - ASA 81mg , Plavis 75mg  x45mo - Atorvastatin 80mg  daily - Continue smoking cessation counseling - Allowing permissive hypertension, can re-start home meds tomorrow  #Pulmonary nodule CT chest revealed bilateral lower lobe nodules and irregular density in right lung apex. No previous imaging available to correlate, but patient states he has been aware of this in the past. Likely will need repeat CT at 3-72mo pending PCP's discretion. - F/u CT chest in 3-60mo  Prior to Admission Living Arrangement: Home Anticipated Discharge Location: PT/OT Barriers to Discharge: PT/OT evaluation, TTE Dispo: Anticipated discharge in approximately 0-1 day(s).   Julian Dame, MD 07/09/2021, 10:37 AM Pager: [phone] After 5pm on weekdays and 1pm on weekends: On Call pager [phone]

## 2021-07-09 NOTE — Evaluation (Signed)
Physical Therapy Evaluation Patient Details Name: Julian Dunlap. MRN: [id number] DOB: [birth year] Today's Date: 07/09/2021  History of Present Illness  Julian Mish. is a 60 y.o. male who presensted to the ED 9/23 with left-sided weakness and right-sided gaze. Head CT (+) for occlusion of distal R ACA, Acute ischemic CVA of R ACA, L vertebral artery occlusion, irregular nodual in R lung apex. PMHx: hyperthyroidism, COPD, essential hypertension,  right sided head trauma from a fall many years ago with seizures and tobacco use who currently smokes 2-3 packs per day   Clinical Impression  Pt admitted with above diagnosis. PTA pt lived alone, active and independent. On eval, he required min assist bed mobility, min assist transfers, and min assist ambulation 30' with RW. Deficits noted in LUE/LE strength, LLE proprioception, balance, and safety awareness. Pt currently with functional limitations due to the deficits listed below (see PT Problem List). Pt will benefit from skilled PT to increase their independence and safety with mobility to allow discharge to the venue listed below.  Pt plans to stay with his sister upon discharge who can provide 24-hour assist. Pt initially hesitant to agree to CIR consult. With further explanation of CIR program and average LOS, pt agreeable to consult.        Recommendations for follow up therapy are one component of a multi-disciplinary discharge planning process, led by the attending physician.  Recommendations may be updated based on patient status, additional functional criteria and insurance authorization.  Follow Up Recommendations CIR    Equipment Recommendations  Rolling walker with 5" wheels    Recommendations for Other Services Rehab consult     Precautions / Restrictions Precautions Precautions: Fall Restrictions Weight Bearing Restrictions: No      Mobility  Bed Mobility Overal bed mobility: Needs Assistance Bed Mobility: Supine to  Sit     Supine to sit: Min assist     General bed mobility comments: cues for sequencing    Transfers Overall transfer level: Needs assistance Equipment used: Rolling walker (2 wheeled) Transfers: Sit to/from Stand Sit to Stand: Min assist         General transfer comment: assist to power up and stabilize balance  Ambulation/Gait Ambulation/Gait assistance: Min assist;+2 safety/equipment Gait Distance (Feet): 30 Feet Assistive device: Rolling walker (2 wheeled) Gait Pattern/deviations: Step-through pattern;Decreased stride length;Decreased weight shift to left Gait velocity: decreased   General Gait Details: min assist to maintain balance and manage RW as pt tends to step outside frame of RW on L. L inattention and decreased proprioception LLE. Pt stating "I don't know where my left leg is unless I'm looking down at it."  Stairs            Wheelchair Mobility    Modified Rankin (Stroke Patients Only) Modified Rankin (Stroke Patients Only) Pre-Morbid Rankin Score: No symptoms Modified Rankin: Moderately severe disability     Balance Overall balance assessment: Needs assistance Sitting-balance support: Feet supported;No upper extremity supported;Single extremity supported Sitting balance-Leahy Scale: Poor Sitting balance - Comments: pt requried at least one UE supported externally; with BUE dynamic task pt lost balance to the L and requried vc/physical assist to correct   Standing balance support: Bilateral upper extremity supported;During functional activity Standing balance-Leahy Scale: Poor Standing balance comment: reliant on external support                             Pertinent Vitals/Pain Pain Assessment: Faces  Pain Score: 8  Faces Pain Scale: Hurts little more Pain Location: headache Pain Descriptors / Indicators: Headache Pain Intervention(s): Monitored during session;Repositioned    Home Living Family/patient expects to be  discharged to:: Private residence Living Arrangements: Alone Available Help at Discharge: Family;Available 24 hours/day Type of Home: House Home Access: Stairs to enter Entrance Stairs-Rails: Right Entrance Stairs-Number of Steps: 2 Home Layout: One level Home Equipment: Shower seat Additional Comments: above set up is for pt's sister's house (planned d/c environment)    Prior Function Level of Independence: Independent               Hand Dominance   Dominant Hand: Right    Extremity/Trunk Assessment   Upper Extremity Assessment Upper Extremity Assessment: Defer to OT evaluation LUE Deficits / Details: LUE slow and deliberate with all movements and clumsy during funcitonal tasks. Grip strength is 4/5. poor thumb to finger coordination. Mild dysdiadochokinesia. LUE Sensation: WNL LUE Coordination: decreased fine motor    Lower Extremity Assessment Lower Extremity Assessment: LLE deficits/detail LLE Deficits / Details: 4/5 gross strength LLE Sensation: decreased proprioception    Cervical / Trunk Assessment Cervical / Trunk Assessment: Normal  Communication   Communication: No difficulties  Cognition Arousal/Alertness: Awake/alert Behavior During Therapy: WFL for tasks assessed/performed Overall Cognitive Status: No family/caregiver present to determine baseline cognitive functioning                                 General Comments: Pt not receptive to how deficits impact safety at home - otherwise good safety awareness while in the room wtih knowing he is unsteady and requires a RW      General Comments General comments (skin integrity, edema, etc.): VSS on RA, pt has report of headache throughout the session    Exercises     Assessment/Plan    PT Assessment Patient needs continued PT services  PT Problem List Decreased strength;Decreased mobility;Decreased safety awareness;Decreased knowledge of precautions;Decreased balance;Pain;Decreased  knowledge of use of DME       PT Treatment Interventions DME instruction;Therapeutic activities;Gait training;Therapeutic exercise;Patient/family education;Balance training;Functional mobility training;Neuromuscular re-education    PT Goals (Current goals can be found in the Care Plan section)  Acute Rehab PT Goals Patient Stated Goal: home to see dog PT Goal Formulation: With patient Time For Goal Achievement: 07/23/21 Potential to Achieve Goals: Good    Frequency Min 4X/week   Barriers to discharge        Co-evaluation PT/OT/SLP Co-Evaluation/Treatment: Yes Reason for Co-Treatment: Complexity of the patient's impairments (multi-system involvement);For patient/therapist safety;To address functional/ADL transfers;Necessary to address cognition/behavior during functional activity PT goals addressed during session: Mobility/safety with mobility;Balance;Proper use of DME OT goals addressed during session: ADL's and self-care;Proper use of Adaptive equipment and DME       AM-PAC PT "6 Clicks" Mobility  Outcome Measure Help needed turning from your back to your side while in a flat bed without using bedrails?: A Little Help needed moving from lying on your back to sitting on the side of a flat bed without using bedrails?: A Little Help needed moving to and from a bed to a chair (including a wheelchair)?: A Little Help needed standing up from a chair using your arms (e.g., wheelchair or bedside chair)?: A Little Help needed to walk in hospital room?: A Little Help needed climbing 3-5 steps with a railing? : A Lot 6 Click Score: 17    End of  Session Equipment Utilized During Treatment: Gait belt Activity Tolerance: Patient tolerated treatment well Patient left: in chair;with call bell/phone within reach;with chair alarm set Nurse Communication: Mobility status PT Visit Diagnosis: Unsteadiness on feet (R26.81);Other abnormalities of gait and mobility (R26.89);Pain    Time:  0955-1020 PT Time Calculation (min) (ACUTE ONLY): 25 min   Charges:   PT Evaluation $PT Eval Moderate Complexity: 1 Mod          Lorrin Goodell, PT  Office # [phone] Pager [phone]   Lorriane Shire 07/09/2021, 12:44 PM

## 2021-07-09 NOTE — Progress Notes (Addendum)
STROKE TEAM PROGRESS NOTE   INTERVAL HISTORY His mom and sister are at the bedside. Answered their questions. Stroke diagnosis, work up and secondary treatment as well as home meds reviewed. Counseled on quitting smoking. He is still having difficulty walking d/t right leg weakness, proprioception and balance. He reports having freq palpitations, but no prev dx of Afib. MRI scan of the brain shows a right anterior cerebral artery distal infarct.  CT angiogram shows right anterior cerebral artery occlusion in the distal V4 segment.  LDL cholesterol is 38 mg percent.  Hemoglobin A1c is 5.6.  Echocardiogram and urine drug screen are pending. Vitals:   07/08/21 2022 07/08/21 2317 07/08/21 2348 07/09/21 0410  BP: (!) 142/69 (!) 144/58 (!) 154/77 (!) 149/76  Pulse: 68 73 69 73  Resp: 18 19 18 19   Temp: 97.7 F (36.5 C) 98.5 F (36.9 C) 98.3 F (36.8 C) 98.1 F (36.7 C)  TempSrc: Oral Oral Oral Oral  SpO2: 95% 95% 95% 94%  Weight: 74.2 kg     Height: 5\' 11"  (1.803 m)      CBC:  Recent Labs  Lab 07/08/21 0905 07/08/21 0907 07/09/21 0545  WBC 7.8  --  9.0  NEUTROABS 4.5  --   --   HGB 15.2 15.3 15.7  HCT 45.7 45.0 47.2  MCV 90.9  --  88.2  PLT 195  --  093   Basic Metabolic Panel:  Recent Labs  Lab 07/08/21 0905 07/08/21 0907 07/09/21 0545  NA 138 141 138  K 3.8 4.0 3.9  CL 106 104 103  CO2 25  --  26  GLUCOSE 113* 106* 89  BUN 15 17 10   CREATININE 1.01 0.90 0.98  CALCIUM 8.7*  --  9.5   Lipid Panel:  Recent Labs  Lab 07/08/21 0905  CHOL 185  TRIG 72  HDL 33*  CHOLHDL 5.6  VLDL 14  LDLCALC 138*   HgbA1c:  Recent Labs  Lab 07/08/21 0905  HGBA1C 5.6   Urine Drug Screen: No results for input(s): LABOPIA, COCAINSCRNUR, LABBENZ, AMPHETMU, THCU, LABBARB in the last 168 hours.  Alcohol Level No results for input(s): ETH in the last 168 hours.  IMAGING past 24 hours CT CHEST W CONTRAST  Result Date: 07/08/2021 CLINICAL DATA:  Lung nodule. EXAM: CT CHEST WITH  CONTRAST TECHNIQUE: Multidetector CT imaging of the chest was performed during intravenous contrast administration. CONTRAST:  147mL OMNIPAQUE IOHEXOL 350 MG/ML SOLN COMPARISON:  CT of same day. FINDINGS: Cardiovascular: Atherosclerosis of thoracic aorta is noted without aneurysm or dissection. Normal cardiac size. No pericardial effusion. Mediastinum/Nodes: No enlarged mediastinal, hilar, or axillary lymph nodes. Thyroid gland, trachea, and esophagus demonstrate no significant findings. Lungs/Pleura: No pneumothorax or pleural effusion is noted. Emphysematous disease is noted bilaterally. 6 mm nodule is noted laterally in the left lower lobe best seen on image number 116 of series 7. 6 mm nodule is noted anteriorly in right lower lobe best seen on image number 88 of series 7. Irregular density measuring 10 x 8 mm is noted in right lung apex with surrounding reticular densities which most likely represent scarring, but neoplasm or malignancy can not be excluded. Upper Abdomen: No acute abnormality. Musculoskeletal: No chest wall abnormality. No acute or significant osseous findings. IMPRESSION: Bilateral lower lobe nodules are noted, each measuring approximately 6 mm. Also noted is 10 x 8 mm irregular density in the right lung apex with surrounding reticular densities which most likely represent scarring, but neoplasm or malignancy can  not be excluded. Non-contrast chest CT at 3-6 months is recommended. If the nodules are stable at time of repeat CT, then future CT at 18-24 months (from today's scan) is considered optional for low-risk patients, but is recommended for high-risk patients. This recommendation follows the consensus statement: Guidelines for Management of Incidental Pulmonary Nodules Detected on CT Images: From the Fleischner Society 2017; Radiology 2017; 284:228-243. Aortic Atherosclerosis (ICD10-I70.0) and Emphysema (ICD10-J43.9). Electronically Signed   By: Marijo Conception M.D.   On: 07/08/2021 16:53    MR BRAIN WO CONTRAST  Result Date: 07/08/2021 CLINICAL DATA:  Left-sided numbness, weakness, facial droop EXAM: MRI HEAD WITHOUT CONTRAST TECHNIQUE: Multiplanar, multiecho pulse sequences of the brain and surrounding structures were obtained without intravenous contrast. COMPARISON:  Same-day noncontrast CT head FINDINGS: Brain: There is diffusion restriction in the right parasagittal frontal and parietal lobes with minimal associated FLAIR signal abnormality consistent with evolving acute infarct. The infarct extends to involve the right body of the corpus callosum. There is also minimal involvement of the cortex of the pre and postcentral gyri. There is a remote infarct in the right parietal lobe more inferolaterally with associated encephalomalacia, chronic hemosiderin staining, and gliosis. Scattered additional foci of FLAIR signal abnormality in the subcortical and periventricular white matter likely reflects sequela of chronic white matter microangiopathy. There is mild global parenchymal volume loss without discernible lobar predominance. The ventricles are not enlarged. There is no mass lesion. There is no midline shift. Vascular: The major flow voids are present. The vasculature is better assessed on the same day CTA head/neck. Skull and upper cervical spine: Normal marrow signal. Sinuses/Orbits: The imaged paranasal sinuses are clear. The globes and orbits are unremarkable. Other: None. IMPRESSION: 1. Acute infarct involving the right parasagittal frontal and parietal lobes as well as portions of the cortex of the right pre and postcentral gyri and body of the corpus callosum, consistent with evolving ACA distribution infarct in the setting of distal ACA occlusion as seen on the prior CTA. 2. Remote right parietal lobe infarct. Electronically Signed   By: Valetta Mole M.D.   On: 07/08/2021 11:27   EEG adult  Result Date: 07/08/2021 Derek Jack, MD     07/08/2021  5:41 PM Routine EEG Report  Devinn Hurwitz. is a 60 y.o. male with a history of stroke who is undergoing an EEG to evaluate for seizures. Report: This EEG was acquired with electrodes placed according to the International 10-20 electrode system (including Fp1, Fp2, F3, F4, C3, C4, P3, P4, O1, O2, T3, T4, T5, T6, A1, A2, Fz, Cz, Pz). The following electrodes were missing or displaced: none. The occipital dominant rhythm was 8.5 Hz. This activity is reactive to stimulation. Drowsiness was manifested by background fragmentation; deeper stages of sleep were identified by K complexes and sleep spindles. There was no focal slowing. There were no interictal epileptiform discharges. There were no electrographic seizures identified. Photic stimulation and hyperventilation were not performed. Impression and clinical correlation: This EEG was obtained while awake and asleep and is normal. Multiple button pushes for fine tremors of his right lower extremity did not have ictal correlate on EEG. The activity was too subtle to be appreciated over video. Focal motor seizures may involve too small a cortical area to be consistently picked up by scalp EEG, therefore consider prolonged EEG if there is high suspicion for clinical seizures. Su Monks, MD [facility] [phone] If 7pm- 7am, please page neurology on call as listed in  [facility].   CT HEAD CODE STROKE WO CONTRAST  Result Date: 07/08/2021 CLINICAL DATA:  Code stroke.  Neuro deficit, acute, stroke suspected EXAM: CT HEAD WITHOUT CONTRAST TECHNIQUE: Contiguous axial images were obtained from the base of the skull through the vertex without intravenous contrast. COMPARISON:  CT Head from April 09, 2020. FINDINGS: Brain: No evidence of acute large vascular infarction, hemorrhage, hydrocephalus, extra-axial collection or mass lesion/mass effect. Remote right infarct infarct with encephalomalacia. Mild patchy white matter hypoattenuation, nonspecific but compatible with chronic  microvascular ischemic disease. Mild atrophy. Vascular: No hyperdense vessel identified. Skull: No acute fracture Sinuses/Orbits: Clear sinuses.  No acute orbital findings. Other: No mastoid effusions. ASPECTS [facility] Stroke Program Early CT Score) Total score (0-10 with 10 being normal): 10. IMPRESSION: 1. No evidence of acute large vascular territory infarct or acute hemorrhage. ASPECTS is 10. 2. Remote right parietal infarct. Code stroke imaging results were communicated on 07/08/2021 at 9:15 am to provider Dr. Rory Percy via secure text paging. Electronically Signed   By: Margaretha Sheffield M.D.   On: 07/08/2021 09:18   CT ANGIO HEAD NECK W WO CM W PERF (CODE STROKE)  Result Date: 07/08/2021 CLINICAL DATA:  Neuro deficit, acute, stroke suspected EXAM: CT ANGIOGRAPHY HEAD AND NECK CT PERFUSION BRAIN TECHNIQUE: Multidetector CT imaging of the head and neck was performed using the standard protocol during bolus administration of intravenous contrast. Multiplanar CT image reconstructions and MIPs were obtained to evaluate the vascular anatomy. Carotid stenosis measurements (when applicable) are obtained utilizing NASCET criteria, using the distal internal carotid diameter as the denominator. Multiphase CT imaging of the brain was performed following IV bolus contrast injection. Subsequent parametric perfusion maps were calculated using RAPID software. CONTRAST:  132mL OMNIPAQUE IOHEXOL 350 MG/ML SOLN COMPARISON:  None. FINDINGS: CTA NECK FINDINGS Aortic arch: Great vessel origins are patent. Right carotid system: No evidence of dissection, stenosis (50% or greater) or occlusion. Mild atherosclerosis at the carotid bifurcation. Left carotid system: No evidence of dissection, stenosis (50% or greater) or occlusion. Vertebral arteries: Vertebral artery is occluded at its origin with irregular reconstitution in the upper neck from collaterals. Right vertebral artery is patent. Skeleton: No evidence of acute abnormality.  Other neck: No evidence of acute abnormality. Upper chest: Emphysema. Irregular/spiculated nodularity in the right lung apex, measuring up to approximately 1.2 cm. Review of the MIP images confirms the above findings CTA HEAD FINDINGS Anterior circulation: Bilateral intracranial ICAs are patent without significant stenosis. Bilateral M1 MCAs are patent without significant stenosis. Bilateral proximal M2 MCAs are patent without hemodynamically significant stenosis. Occlusion of the distal right ACA, A4 segment (series 9, image 105). Posterior circulation: Right vertebral artery appears to terminate as PICA. The reconstituted left vertebral artery is opacified intradurally. Small vertebrobasilar system with small P1 PCAs and prominent bilateral posterior communicating arteries, anatomic variant. Bilateral PCAs are patent without proximal hemodynamically significant stenosis. Venous sinuses: As permitted by contrast timing, patent. Left frontal developmental venous anomaly. CT Brain Perfusion Findings: ASPECTS: 10 CBF (<30%) Volume: 32mL Perfusion (Tmax>6.0s) volume: 26mL Mismatch Volume: 66mL Infarction Location:Distal right ACA territory. IMPRESSION: 1. Occlusion of the distal right ACA, A4 segment. 2. Approximately 15 mL of core infarct in the correlate distal right ACA territory with 15 mL of surrounding penumbra. 3. Left vertebral artery is occluded at its origin with irregular reconstitution in the upper neck from collaterals. The right vertebral artery appears to largely terminate as PICA with small vertebrobasilar system and prominent bilateral posterior communicating arteries. 4. Irregular nodularity  in the right lung apex, measuring up to approximately 1.2 cm. Differential considerations include nodular scarring versus malignancy (particularly given the patient's extensive emphysema). Recommend dedicated chest CT when feasible two fully evaluate the chest. Impressions #1 through 3 discussed with Dr. Rory Percy via  telephone at 9:30 a.m and impressions #1-2 discussed with Dr. Smitty Pluck at 9:35 a.m. Impression #4 conveyed to Dr. Rory Percy via text page at 10:09 a.m. Electronically Signed   By: Margaretha Sheffield M.D.   On: 07/08/2021 10:14    PHYSICAL EXAM General: Appears well-developed pleasant middle-age Caucasian male; no distress. Psych: Affect appropriate to situation Eyes: No scleral injection HENT: No OP obstrucion Head: Normocephalic.  Cardiovascular: Normal rate and regular rhythm.  Respiratory: Effort normal and breath sounds normal to anterior ascultation GI: Soft.  No distension. There is no tenderness.  Skin: WDI    Neurological Examination Mental Status: Alert, oriented, thought content appropriate.  Speech fluent without evidence of aphasia. Able to follow 3 step commands without difficulty. Cranial Nerves: II: Visual fields grossly normal,  III,IV, VI: ptosis not present, extra-ocular motions intact bilaterally, pupils equal, round, reactive to light and accommodation V,VII: smile symmetric, facial light touch sensation normal bilaterally VIII: hearing normal bilaterally IX,X: uvula rises symmetrically XI: bilateral shoulder shrug XII: midline tongue extension Motor: Right : Upper extremity   5/5    Left:     Upper extremity   5/5  Lower extremity   5/5     Lower extremity   5/5 Tone and bulk:normal tone throughout; no atrophy noted.  Diminished fine finger movements on the left.  Orbits right over left upper extremity.  Trace left lower extremity weakness only on double simultaneous testing. Sensory: Pinprick and light touch intact throughout, bilaterally Deep Tendon Reflexes: 2+ and symmetric throughout Plantars: Right: downgoing   Left: downgoing Cerebellar: normal finger-to-nose, normal rapid alternating movements and normal heel-to-shin test Gait: normal gait and station   ASSESSMENT/PLAN Mr. Sequoyah Counterman. is a 60 y.o. male who presented from home via  EMS for evaluation of left-sided weakness and right-sided gaze with some left-sided neglect. Outside the window for IV tPA. Vessel imaging reviewed personally along with neuroradiologist and neuro interventionalist-possible distal ACA occlusion but no proximal vessel to go after.  Stroke: Right ACA infarct which appears to be embolic. Cryptogenic thus far. Recommend TEE (request made to cardiology already for Monday) and if neg, loop placement.  Code Stroke CT head No acute abnormality. Small vessel disease. Atrophy. ASPECTS 10.    CTA head & neck distal ACA occlusion CT perfusion Mismatch volume 23ml MRI  Right parasagittal frontal and parietal lobes, corpus callosum, ACA occulsion. Old posterior right MCA stroke. 2D Echo pending LDL 138 HgbA1c 5.6 VTE prophylaxis - lovenox    Diet   Diet Heart Room service appropriate? Yes; Fluid consistency: Thin   none  prior to admission, now on aspirin 81 mg daily and clopidogrel 75 mg daily. Continue DAPT for 3 weeks, then monotherapy unless Afib is detected, then recommend Eliquis alone.  Therapy recommendations:  CIR per rehab notes Disposition:  pending  Hypertension Home meds:  Norvasc, Inderal Stable Permissive hypertension (OK if < 220/120) but gradually normalize in 5-7 days Long-term BP goal normotensive  Hyperlipidemia Home meds:   LDL 138, goal < 70 Add Lipitor 80mg  po daily High intensity statin  Continue statin at discharge  Diabetes type II No dx HgbA1c 5.6, goal < 7.0 CBGs Recent Labs    07/08/21 0901  07/08/21 0954  GLUCAP 109* 95    SSI  Other Stroke Risk Factors Cigarette smoker; advised to stop smoking Migraines   Other Active Problems H/o TBI with report of seizures RLE tremors noted- EEG neg for correlation  Hospital day # 1  Desiree Metzger-Cihelka, ARNP-C, ANVP-BC Pager: [phone]   STROKE MD NOTE : He presented with right-sided weakness secondary to left distal ACA infarct likely of embolic  etiology.  He initially presented with high NIH stroke scale of 18 for unclear reason which appears to have improved.  EEG shows no seizure activity.  He is showing improvement but still has some mild residual right-sided weakness.  Continue ongoing stroke work-up.  Check echocardiogram and urine drug screen.  Likely needed TEE and prolonged cardiac monitoring at discharge.  Continue dual antiplatelet therapy for now.  Check urine drug screen.  Continue ongoing therapy and rehab.  Long discussion with patient and family at the bedside and answered questions.  Greater than 50% time during the 35-minute visit was spent on counseling and coordination of care about his cryptogenic stroke and discussion about evaluation treatment plan and answering questions.  Antony Contras MD   To contact Stroke Continuity provider, please refer to [email or web address] After hours, contact General Neurology

## 2021-07-09 NOTE — Evaluation (Signed)
Occupational Therapy Evaluation Patient Details Name: Julian Dunlap. MRN: [id number] DOB: [birth year] Today's Date: 07/09/2021   History of Present Illness Julian Dunlap. is a 60 y.o. male who presensted to the ED 9/23 with left-sided weakness and right-sided gaze. Head CT (+) for occlusion of distal R ACA, Acute ischemic CVA of R ACA, L vertebral artery occlusion, irregular nodual in R lung apex. PMHx: hyperthyroidism, COPD, essential hypertension,  right sided head trauma from a fall many years ago with seizures and tobacco use who currently smokes 2-3 packs per day   Clinical Impression   Julian Dunlap was indep in all ADL/IADLs and mobility prior to the above admission list. He plans to d/c to his sisters house where he will have 24/7 support. Pt requires min A and RW for all mobility at this time due to L sided weakness; and up to mod A for ADLs. Pt reports improvement tin his vision, however upon assessment he has mild limitations in his L lower quadrant and it should be further assessed. During table top pencil and paper tasks, pt knocked over his cup with his LUE 3x, and was overall clumsy with the functional use of his LUE. Pt would benefit from continued OT to address his L sided deficits. Recommend CIR at d/c however is very concerned about his dog and would like to d/c home.      Recommendations for follow up therapy are one component of a multi-disciplinary discharge planning process, led by the attending physician.  Recommendations may be updated based on patient status, additional functional criteria and insurance authorization.   Follow Up Recommendations  CIR;Supervision/Assistance - 24 hour (Pt hesitant to agree to post-acute rehab at this time due to concerns of his dog at home without him. if pt declines CIR, recommend HHOT.)    Equipment Recommendations   (RW)    Recommendations for Other Services Rehab consult     Precautions / Restrictions Precautions Precautions:  Fall Restrictions Weight Bearing Restrictions: No      Mobility Bed Mobility Overal bed mobility: Needs Assistance Bed Mobility: Supine to Sit     Supine to sit: Min assist     General bed mobility comments: min A for verbal cues and assist with LLE    Transfers Overall transfer level: Needs assistance Equipment used: Rolling walker (2 wheeled) Transfers: Sit to/from Stand Sit to Stand: Min assist         General transfer comment: min A for one lsight LOB and verbal cues for positioning and use of RW    Balance Overall balance assessment: Needs assistance Sitting-balance support: Feet supported Sitting balance-Leahy Scale: Poor Sitting balance - Comments: pt requried at least one UE supported externally; with BUE dynamic task pt lost balance to the L and requried vc/physical assist to correct   Standing balance support: Bilateral upper extremity supported Standing balance-Leahy Scale: Poor                             ADL either performed or assessed with clinical judgement   ADL Overall ADL's : Needs assistance/impaired Eating/Feeding: Sitting;Independent Eating/Feeding Details (indicate cue type and reason): Pt noted knocking over cup 3x with L hand during table top tasks Grooming: Min guard;Standing   Upper Body Bathing: Sitting;Min guard Upper Body Bathing Details (indicate cue type and reason): min guard for sitting balance during task Lower Body Bathing: Moderate assistance;Sit to/from stand   Upper Body Dressing :  Min guard;Sitting   Lower Body Dressing: Moderate assistance;Sit to/from stand   Toilet Transfer: RW;Ambulation;Minimal assistance   Toileting- Water quality scientist and Hygiene: Min guard;Sitting/lateral lean Toileting - Clothing Manipulation Details (indicate cue type and reason): min guard for sitting balance     Functional mobility during ADLs: Minimal assistance General ADL Comments: min A for functional ambulation with RW  due to slight LOB and for vc for RW management. Mod a for lowerr body tasks due to LLE weakness, and min guard for all dynamic sitting tasks due to poor sitting balance     Vision Patient Visual Report: No change from baseline Vision Assessment?: Yes Eye Alignment: Within Functional Limits Ocular Range of Motion: Within Functional Limits Alignment/Gaze Preference: Within Defined Limits Tracking/Visual Pursuits: Able to track stimulus in all quads without difficulty Saccades: Within functional limits Convergence: Within functional limits Visual Fields: Left visual field deficit Additional Comments: L lower visual field seems impacted via field testing from behind; pt completed letter cancelation tasks with only 1 error; clock draw with 1 error (difficult to asses if this was cognitive or baseline understading). pt also overshooting/undershooting with LUE, knocking over cups on his tray table     Perception     Praxis      Pertinent Vitals/Pain Pain Assessment: Faces Faces Pain Scale: Hurts a little bit Pain Location: headache Pain Descriptors / Indicators: Discomfort Pain Intervention(s): Limited activity within patient's tolerance;Monitored during session;Patient requesting pain meds-RN notified     Hand Dominance Right   Extremity/Trunk Assessment Upper Extremity Assessment Upper Extremity Assessment: LUE deficits/detail LUE Deficits / Details: LUE slow and deliberate with all movements and clumsy during funcitonal tasks. Grip strength is 4/5. poor thumb to finger coordination. Mild dysdiadochokinesia. LUE Sensation: WNL LUE Coordination: decreased fine motor   Lower Extremity Assessment Lower Extremity Assessment: Defer to PT evaluation   Cervical / Trunk Assessment Cervical / Trunk Assessment: Normal   Communication Communication Communication: No difficulties   Cognition Arousal/Alertness: Awake/alert Behavior During Therapy: WFL for tasks assessed/performed Overall  Cognitive Status: No family/caregiver present to determine baseline cognitive functioning                                 General Comments: Pt not receptive to how deficits impact safety at home - otherwise good safety awareness while in the room wtih knowing he is unsteady and requires a RW   General Comments  VSS on RA, pt has report of headache throughout the session    Exercises     Shoulder Instructions      Home Living Family/patient expects to be discharged to:: Private residence Living Arrangements: Alone Available Help at Discharge: Family;Available 24 hours/day (Pt plans on staying at his sister's house upon d/c.) Type of Home: House Home Access: Stairs to enter [employer] of Steps: 2 Entrance Stairs-Rails: Right Home Layout: One level     Bathroom Shower/Tub: Occupational psychologist: Standard     Home Equipment: Shower seat   Additional Comments: above set up is for pt's sister's house (planned d/c environment)      Prior Functioning/Environment Level of Independence: Independent                 OT Problem List: Decreased strength;Decreased range of motion;Decreased activity tolerance;Impaired balance (sitting and/or standing);Decreased safety awareness;Decreased knowledge of use of DME or AE;Decreased knowledge of precautions;Impaired UE functional use;Pain      OT  Treatment/Interventions: Self-care/ADL training;Therapeutic exercise;DME and/or AE instruction;Therapeutic activities;Patient/family education;Balance training    OT Goals(Current goals can be found in the care plan section) Acute Rehab OT Goals Patient Stated Goal: home to see dog OT Goal Formulation: With patient Time For Goal Achievement: 07/23/21 Potential to Achieve Goals: Fair ADL Goals Pt Will Perform Upper Body Bathing: with set-up;sitting Pt Will Perform Lower Body Bathing: with set-up;sit to/from stand Pt Will Perform Upper Body Dressing: with  modified independence;sitting Pt Will Perform Lower Body Dressing: with supervision;sit to/from stand Pt Will Transfer to Toilet: with modified independence;ambulating Pt Will Perform Toileting - Clothing Manipulation and hygiene: with modified independence;sitting/lateral leans Pt Will Perform Tub/Shower Transfer: with min guard assist;ambulating  OT Frequency: Min 2X/week   Barriers to D/C:            Co-evaluation PT/OT/SLP Co-Evaluation/Treatment: Yes Reason for Co-Treatment: Complexity of the patient's impairments (multi-system involvement);For patient/therapist safety;To address functional/ADL transfers   OT goals addressed during session: ADL's and self-care;Proper use of Adaptive equipment and DME      AM-PAC OT "6 Clicks" Daily Activity     Outcome Measure Help from another person eating meals?: None Help from another person taking care of personal grooming?: A Little Help from another person toileting, which includes using toliet, bedpan, or urinal?: A Little Help from another person bathing (including washing, rinsing, drying)?: A Lot Help from another person to put on and taking off regular upper body clothing?: A Little Help from another person to put on and taking off regular lower body clothing?: A Lot 6 Click Score: 17   End of Session Equipment Utilized During Treatment: Gait belt;Rolling walker Nurse Communication: Mobility status;Precautions;Weight bearing status  Activity Tolerance: Patient tolerated treatment well Patient left: in chair;with call bell/phone within reach;with chair alarm set  OT Visit Diagnosis: Unsteadiness on feet (R26.81);Other abnormalities of gait and mobility (R26.89);Muscle weakness (generalized) (M62.81);Apraxia (R48.2);Pain;Hemiplegia and hemiparesis Hemiplegia - Right/Left: Left Hemiplegia - dominant/non-dominant: Non-Dominant Hemiplegia - caused by: Cerebral infarction                Time: 1000-1024 OT Time Calculation (min): 24  min Charges:  OT General Charges $OT Visit: 1 Visit OT Evaluation $OT Eval Moderate Complexity: 1 Mod   Oliviana Mcgahee A Shante Maysonet 07/09/2021, 11:37 AM

## 2021-07-09 NOTE — Progress Notes (Signed)
  Echocardiogram 2D Echocardiogram has been performed.  Julian Dunlap 07/09/2021, 3:03 PM

## 2021-07-10 DIAGNOSIS — I639 Cerebral infarction, unspecified: Secondary | ICD-10-CM | POA: Diagnosis not present

## 2021-07-10 MED ORDER — LOSARTAN POTASSIUM 50 MG PO TABS
50.0000 mg | ORAL_TABLET | Freq: Every day | ORAL | Status: DC
  Administered 2021-07-10 – 2021-07-11 (×2): 50 mg via ORAL
  Filled 2021-07-10 (×2): qty 1

## 2021-07-10 NOTE — Progress Notes (Signed)
STROKE TEAM PROGRESS NOTE   INTERVAL HISTORY  He is sitting up in bed.  He is improving but is still having difficulty walking d/t right leg weakness, proprioception and balance.  Echocardiogram shows ejection fraction of 45% with hypokinetic apex but no definite clot.  Urine drug screen is negative.  TEE and loop recorder hopefully pending for tomorrow.  Vital signs stable.  Neurological exam unchanged.    Vitals:   07/09/21 2055 07/09/21 2336 07/10/21 0419 07/10/21 1154  BP: (!) 155/76 122/62 135/62 139/72  Pulse: 77 69 72 78  Resp:  18 17 20   Temp: 98.3 F (36.8 C) 97.8 F (36.6 C) 98.2 F (36.8 C) 97.6 F (36.4 C)  TempSrc: Oral Axillary Oral Oral  SpO2: 100% 93% 94% 95%  Weight:      Height:       CBC:  Recent Labs  Lab 07/08/21 0905 07/08/21 0907 07/09/21 0545  WBC 7.8  --  9.0  NEUTROABS 4.5  --   --   HGB 15.2 15.3 15.7  HCT 45.7 45.0 47.2  MCV 90.9  --  88.2  PLT 195  --  756   Basic Metabolic Panel:  Recent Labs  Lab 07/08/21 0905 07/08/21 0907 07/09/21 0545  NA 138 141 138  K 3.8 4.0 3.9  CL 106 104 103  CO2 25  --  26  GLUCOSE 113* 106* 89  BUN 15 17 10   CREATININE 1.01 0.90 0.98  CALCIUM 8.7*  --  9.5   Lipid Panel:  Recent Labs  Lab 07/08/21 0905  CHOL 185  TRIG 72  HDL 33*  CHOLHDL 5.6  VLDL 14  LDLCALC 138*   HgbA1c:  Recent Labs  Lab 07/08/21 0905  HGBA1C 5.6   Urine Drug Screen:  Recent Labs  Lab 07/09/21 1844  LABOPIA NONE DETECTED  COCAINSCRNUR NONE DETECTED  LABBENZ NONE DETECTED  AMPHETMU NONE DETECTED  THCU NONE DETECTED  LABBARB NONE DETECTED    Alcohol Level No results for input(s): ETH in the last 168 hours.  IMAGING past 24 hours ECHOCARDIOGRAM COMPLETE  Result Date: 07/09/2021    ECHOCARDIOGRAM REPORT   Patient Name:   Julian Dunlap. Date of Exam: 07/09/2021 Medical Rec #:  [id number]         Height:       71.0 in Accession #:    [id number]        Weight:       163.6 lb Date of Birth:  24-Jan-1961         BSA:           1.936 m Patient Age:    60 years          BP:           135/88 mmHg Patient Gender: M                 HR:           84 bpm. Exam Location:  Inpatient Procedure: 2D Echo, Cardiac Doppler and Color Doppler Indications:    CVA  History:        Patient has no prior history of Echocardiogram examinations.                 COPD; Risk Factors:Hypertension, Dyslipidemia and Current                 Smoker.  Sonographer:    Dustin Flock RDCS Referring Phys: [place]  Sonographer Comments: Technically difficult study due to poor echo windows. Image acquisition challenging due to COPD. IMPRESSIONS  1. The apex is hypokinetic. Echocontrast used, there is swirling contrast in the apex but no clear thrombus. . Left ventricular ejection fraction, by estimation, is 45%. The left ventricle has mildly decreased function. The left ventricle demonstrates regional wall motion abnormalities (see scoring diagram/findings for description). Left ventricular diastolic parameters are consistent with Grade I diastolic dysfunction (impaired relaxation).  2. Right ventricular systolic function is normal. The right ventricular size is normal. Tricuspid regurgitation signal is inadequate for assessing PA pressure.  3. The mitral valve is normal in structure. No evidence of mitral valve regurgitation. No evidence of mitral stenosis.  4. The aortic valve is tricuspid. Aortic valve regurgitation is not visualized. No aortic stenosis is present.  5. The inferior vena cava is normal in size with greater than 50% respiratory variability, suggesting right atrial pressure of 3 mmHg. FINDINGS  Left Ventricle: The apex is hypokinetic. Echocontrast used, there is swirling contrast in the apex but no clear thrombus. Left ventricular ejection fraction, by estimation, is 45%. The left ventricle has mildly decreased function. The left ventricle demonstrates regional wall motion abnormalities. The left ventricular internal cavity size  was normal in size. There is no left ventricular hypertrophy. Left ventricular diastolic parameters are consistent with Grade I diastolic dysfunction (impaired relaxation). Normal left ventricular filling pressure. Right Ventricle: The right ventricular size is normal. Right vetricular wall thickness was not well visualized. Right ventricular systolic function is normal. Tricuspid regurgitation signal is inadequate for assessing PA pressure. Left Atrium: Left atrial size was normal in size. Right Atrium: Right atrial size was normal in size. Pericardium: There is no evidence of pericardial effusion. Mitral Valve: The mitral valve is normal in structure. No evidence of mitral valve regurgitation. No evidence of mitral valve stenosis. Tricuspid Valve: The tricuspid valve is normal in structure. Tricuspid valve regurgitation is not demonstrated. No evidence of tricuspid stenosis. Aortic Valve: The aortic valve is tricuspid. Aortic valve regurgitation is not visualized. No aortic stenosis is present. Aortic valve mean gradient measures 1.3 mmHg. Aortic valve peak gradient measures 3.6 mmHg. Aortic valve area, by VTI measures 4.92 cm. Pulmonic Valve: The pulmonic valve was not well visualized. Pulmonic valve regurgitation is not visualized. No evidence of pulmonic stenosis. Aorta: The aortic root is normal in size and structure. Venous: The inferior vena cava is normal in size with greater than 50% respiratory variability, suggesting right atrial pressure of 3 mmHg. IAS/Shunts: No atrial level shunt detected by color flow Doppler.  LEFT VENTRICLE PLAX 2D LVIDd:         4.90 cm  Diastology LVIDs:         4.10 cm  LV e' medial:    7.94 cm/s LV PW:         0.90 cm  LV E/e' medial:  7.5 LV IVS:        0.80 cm  LV e' lateral:   7.07 cm/s LVOT diam:     2.60 cm  LV E/e' lateral: 8.4 LV SV:         82 LV SV Index:   43 LVOT Area:     5.31 cm  RIGHT VENTRICLE RV Basal diam:  2.60 cm RV S prime:     7.29 cm/s TAPSE (M-mode):  2.4 cm LEFT ATRIUM           Index       RIGHT ATRIUM  Index LA diam:      3.40 cm 1.76 cm/m  RA Area:     9.52 cm LA Vol (A4C): 21.7 ml 11.21 ml/m RA Volume:   19.60 ml 10.12 ml/m  AORTIC VALVE AV Area (Vmax):    4.96 cm AV Area (Vmean):   6.04 cm AV Area (VTI):     4.92 cm AV Vmax:           94.47 cm/s AV Vmean:          50.506 cm/s AV VTI:            0.167 m AV Peak Grad:      3.6 mmHg AV Mean Grad:      1.3 mmHg LVOT Vmax:         88.30 cm/s LVOT Vmean:        57.500 cm/s LVOT VTI:          0.155 m LVOT/AV VTI ratio: 0.93  AORTA Ao Root diam: 3.10 cm MITRAL VALVE MV Area (PHT): 3.07 cm    SHUNTS MV Decel Time: 247 msec    Systemic VTI:  0.16 m MV E velocity: 59.40 cm/s  Systemic Diam: 2.60 cm MV A velocity: 91.80 cm/s MV E/A ratio:  0.65 Carlyle Dolly MD Electronically signed by Carlyle Dolly MD Signature Date/Time: 07/09/2021/4:23:41 PM    Final     PHYSICAL EXAM General: Appears well-developed pleasant middle-age Caucasian male; no distress. Psych: Affect appropriate to situation Eyes: No scleral injection HENT: No OP obstrucion Head: Normocephalic.  Cardiovascular: Normal rate and regular rhythm.  Respiratory: Effort normal and breath sounds normal to anterior ascultation GI: Soft.  No distension. There is no tenderness.  Skin: WDI    Neurological Examination Mental Status: Alert, oriented, thought content appropriate.  Speech fluent without evidence of aphasia. Able to follow 3 step commands without difficulty. Cranial Nerves: II: Visual fields grossly normal,  III,IV, VI: ptosis not present, extra-ocular motions intact bilaterally, pupils equal, round, reactive to light and accommodation V,VII: smile symmetric, facial light touch sensation normal bilaterally VIII: hearing normal bilaterally IX,X: uvula rises symmetrically XI: bilateral shoulder shrug XII: midline tongue extension Motor: Right : Upper extremity   5/5    Left:     Upper extremity   5/5  Lower  extremity   5/5     Lower extremity   5/5 Tone and bulk:normal tone throughout; no atrophy noted.  Diminished fine finger movements on the left.  Orbits right over left upper extremity.  Trace left lower extremity weakness only on double simultaneous testing. Sensory: Pinprick and light touch intact throughout, bilaterally Deep Tendon Reflexes: 2+ and symmetric throughout Plantars: Right: downgoing   Left: downgoing Cerebellar: normal finger-to-nose, normal rapid alternating movements and normal heel-to-shin test Gait: normal gait and station   ASSESSMENT/PLAN Mr. Andrik Sandt. is a 60 y.o. male who presented from home via EMS for evaluation of left-sided weakness and right-sided gaze with some left-sided neglect. Outside the window for IV tPA. Vessel imaging reviewed personally along with neuroradiologist and neuro interventionalist-possible distal ACA occlusion but no proximal vessel to go after.  Stroke: Right ACA infarct which appears to be embolic. Cryptogenic thus far. Recommend TEE (request made to cardiology already for Monday) and if neg, loop placement.  Code Stroke CT head No acute abnormality. Small vessel disease. Atrophy. ASPECTS 10.    CTA head & neck distal ACA occlusion CT perfusion Mismatch volume 36ml MRI  Right parasagittal frontal and parietal lobes,  corpus callosum, ACA occulsion. Old posterior right MCA stroke. 2D Echo pending LDL 138 HgbA1c 5.6 VTE prophylaxis - lovenox    Diet   Diet Heart Room service appropriate? Yes; Fluid consistency: Thin   none  prior to admission, now on aspirin 81 mg daily and clopidogrel 75 mg daily. Continue DAPT for 3 weeks, then monotherapy unless Afib is detected, then recommend Eliquis alone.  Therapy recommendations:  CIR per rehab notes Disposition:  pending  Hypertension Home meds:  Norvasc, Inderal Stable Permissive hypertension (OK if < 220/120) but gradually normalize in 5-7 days Long-term BP goal  normotensive  Hyperlipidemia Home meds:   LDL 138, goal < 70 Add Lipitor 80mg  po daily High intensity statin  Continue statin at discharge  Diabetes type II No dx HgbA1c 5.6, goal < 7.0 CBGs Recent Labs    07/08/21 0901 07/08/21 0954  GLUCAP 109* 95    SSI  Other Stroke Risk Factors Cigarette smoker; advised to stop smoking Migraines   Other Active Problems H/o TBI with report of seizures RLE tremors noted- EEG neg for correlation  Hospital day # 2   He is showing improvement but still has some mild residual right-sided weakness.  Continue ongoing stroke work-up.  Check  TEE and and loop recorder prior to discharge..  Continue dual antiplatelet therapy for now.    Continue ongoing therapy and rehab.  Long discussion with patient and family at the bedside and answered questions.  Greater than 50% time during the 25-minute visit was spent on counseling and coordination of care about his cryptogenic stroke and discussion about evaluation treatment plan and answering questions.  Antony Contras MD   To contact Stroke Continuity provider, please refer to [email or web address] After hours, contact General Neurology

## 2021-07-10 NOTE — Plan of Care (Signed)
  Problem: Education: Goal: Knowledge of General Education information will improve Description: Including pain rating scale, medication(s)/side effects and non-pharmacologic comfort measures Outcome: Progressing   Problem: Health Behavior/Discharge Planning: Goal: Ability to manage health-related needs will improve Outcome: Progressing   Problem: Clinical Measurements: Goal: Ability to maintain clinical measurements within normal limits will improve Outcome: Progressing Goal: Will remain free from infection Outcome: Progressing Goal: Diagnostic test results will improve Outcome: Progressing Goal: Respiratory complications will improve Outcome: Progressing Goal: Cardiovascular complication will be avoided Outcome: Progressing   Problem: Activity: Goal: Risk for activity intolerance will decrease Outcome: Progressing   Problem: Nutrition: Goal: Adequate nutrition will be maintained Outcome: Progressing   Problem: Coping: Goal: Level of anxiety will decrease Outcome: Progressing   Problem: Elimination: Goal: Will not experience complications related to bowel motility Outcome: Progressing Goal: Will not experience complications related to urinary retention Outcome: Progressing   Problem: Pain Managment: Goal: General experience of comfort will improve Outcome: Progressing   Problem: Safety: Goal: Ability to remain free from injury will improve Outcome: Progressing   Problem: Skin Integrity: Goal: Risk for impaired skin integrity will decrease Outcome: Progressing   Problem: Education: Goal: Knowledge of disease or condition will improve Outcome: Progressing Goal: Knowledge of secondary prevention will improve Outcome: Progressing Goal: Knowledge of patient specific risk factors addressed and post discharge goals established will improve Outcome: Progressing Goal: Individualized Educational Video(s) Outcome: Progressing   Problem: Health Behavior/Discharge  Planning: Goal: Ability to manage health-related needs will improve Outcome: Progressing   Problem: Self-Care: Goal: Ability to participate in self-care as condition permits will improve Outcome: Progressing   Problem: Ischemic Stroke/TIA Tissue Perfusion: Goal: Complications of ischemic stroke/TIA will be minimized Outcome: Progressing

## 2021-07-10 NOTE — Progress Notes (Addendum)
   Subjective:  Julian Dunlap  Patient evaluated at bedside this AM. Continues to have some difficulty due to his left sided weakness. Otherwise no new complaints, denies dyspnea or chest pain. Discussed plan for CIR and outpatient cardiology follow-up.  Objective:  Vital signs in last 24 hours: Vitals:   07/09/21 1603 07/09/21 2055 07/09/21 2336 07/10/21 0419  BP: (!) 141/75 (!) 155/76 122/62 135/62  Pulse: 84 77 69 72  Resp: 20  18 17   Temp: 97.6 F (36.4 C) 98.3 F (36.8 C) 97.8 F (36.6 C) 98.2 F (36.8 C)  TempSrc: Oral Oral Axillary Oral  SpO2: 97% 100% 93% 94%  Weight:      Height:       General: Resting comfortably in no acute distress CV: Regular rate, rhythm. No murmurs appreciated. Warm extremities. Pulm: Normal work of breathing on room air. Clear to ausculation bilaterally. Neuro: Awake, alert, conversing appropriately. 4/5 strength in LUE, LLE. 5/5 strength in RUE, RLE. Sensation mildly decreased in LUE, otherwise in tact.  Assessment/Plan:  Julian Dunlap is 60yo person with HLD, HTN, COPD, hyperthyroidism, tobacco use disorder admitted 9/23 for acute CVA and found to have decreased systolic function on TTE, now pending CIR placement.  Active Problems:   CVA (cerebral vascular accident) ([place])  #Acute ischemic CVA of R ACA No acute changes, continues to have mild decreased strength on LLE, LUE. Continued to discuss tobacco cessation. Will continue with DAPT.  Pending CIR evaluation. - ASA 81mg , Plavix 75mg  x68mo - Atorvastatin 80mg  daily - Smoking cessation counseling - Pending CIR placement.  #Systolic dysfunction with RWMA Routine TTE yesterday during stroke work-up revealed EF 45% and RWMA in left ventricle. No clinical signs of heart failure present, no previous diagnosis. Discussed with cardiology yesterday evening, will defer to outpatient cardiac ischemic evaluation given acute ischemic stroke. Discussed this with patient, who understands and is agreeable to  outpatient follow-up.  - Follow-up outpatient cardiology  #Hypertension BP has been mildly elevated throughout admission given we have allowed permissive hypertension w/ acute CVA. Patient on amlodipine at home. However, given new decreased systolic dysfunction will benefit more from ACE/ARB. Will initiate losartan here. - Start losartan 50mg  daily - D/c home amlodipine - BMP in AM  Prior to Admission Living Arrangement: Home Anticipated Discharge Location: CIR Barriers to Discharge: placement Dispo: Anticipated discharge in approximately 1-2 day(s).   Sanjuan Dame, MD 07/10/2021, 11:39 AM Pager: [phone] After 5pm on weekdays and 1pm on weekends: On Call pager [phone]

## 2021-07-11 ENCOUNTER — Encounter (HOSPITAL_COMMUNITY): Payer: Self-pay | Admitting: Anesthesiology

## 2021-07-11 ENCOUNTER — Inpatient Hospital Stay (HOSPITAL_COMMUNITY): Payer: Medicare Other | Admitting: Anesthesiology

## 2021-07-11 ENCOUNTER — Encounter (HOSPITAL_COMMUNITY)
Admission: EM | Disposition: A | Discharge status: Home / Self Care | Payer: Self-pay | Source: Home / Self Care | Attending: Internal Medicine

## 2021-07-11 ENCOUNTER — Inpatient Hospital Stay (HOSPITAL_COMMUNITY): Payer: Medicare Other

## 2021-07-11 DIAGNOSIS — I639 Cerebral infarction, unspecified: Secondary | ICD-10-CM | POA: Diagnosis not present

## 2021-07-11 DIAGNOSIS — I340 Nonrheumatic mitral (valve) insufficiency: Secondary | ICD-10-CM | POA: Diagnosis not present

## 2021-07-11 DIAGNOSIS — I6300 Cerebral infarction due to thrombosis of unspecified precerebral artery: Secondary | ICD-10-CM | POA: Diagnosis not present

## 2021-07-11 HISTORY — PX: TEE WITHOUT CARDIOVERSION: SHX5443

## 2021-07-11 HISTORY — PX: BUBBLE STUDY: SHX6837

## 2021-07-11 LAB — BASIC METABOLIC PANEL
Anion gap: 8 (ref 5–15)
BUN: 14 mg/dL (ref 6–20)
CO2: 25 mmol/L (ref 22–32)
Calcium: 9.2 mg/dL (ref 8.9–10.3)
Chloride: 104 mmol/L (ref 98–111)
Creatinine, Ser: 0.92 mg/dL (ref 0.61–1.24)
GFR, Estimated: >60 mL/min (ref >60)
Glucose, Bld: 104 mg/dL — ABNORMAL HIGH (ref 70–99)
Potassium: 3.9 mmol/L (ref 3.5–5.1)
Sodium: 137 mmol/L (ref 135–145)

## 2021-07-11 SURGERY — ECHOCARDIOGRAM, TRANSESOPHAGEAL
Anesthesia: Monitor Anesthesia Care

## 2021-07-11 MED ORDER — CLOPIDOGREL BISULFATE 75 MG PO TABS: 75.0000 mg | ORAL_TABLET | Freq: Every day | ORAL | 0 refills | Status: DC

## 2021-07-11 MED ORDER — ATORVASTATIN CALCIUM 80 MG PO TABS: 80.0000 mg | ORAL_TABLET | Freq: Every day | ORAL | 0 refills | Status: AC

## 2021-07-11 MED ORDER — LOSARTAN POTASSIUM 50 MG PO TABS: 50.0000 mg | ORAL_TABLET | Freq: Every day | ORAL | 0 refills | Status: DC

## 2021-07-11 MED ORDER — PROPOFOL 500 MG/50ML IV EMUL
INTRAVENOUS | Status: DC | PRN
  Administered 2021-07-11: 100 ug/kg/min via INTRAVENOUS

## 2021-07-11 MED ORDER — ASPIRIN 81 MG PO TBEC: 81.0000 mg | DELAYED_RELEASE_TABLET | Freq: Every day | ORAL | 0 refills | Status: AC

## 2021-07-11 MED ORDER — SODIUM CHLORIDE 0.9 % IV SOLN
INTRAVENOUS | Status: DC
Start: 2021-07-11 — End: 2021-07-11

## 2021-07-11 MED ORDER — SODIUM CHLORIDE 0.9 % IV SOLN
INTRAVENOUS | Status: AC | PRN
  Administered 2021-07-11: 500 mL via INTRAMUSCULAR

## 2021-07-11 NOTE — Plan of Care (Signed)
  Problem: Education: Goal: Knowledge of General Education information will improve Description: Including pain rating scale, medication(s)/side effects and non-pharmacologic comfort measures Outcome: Adequate for Discharge   Problem: Health Behavior/Discharge Planning: Goal: Ability to manage health-related needs will improve Outcome: Adequate for Discharge   Problem: Clinical Measurements: Goal: Ability to maintain clinical measurements within normal limits will improve Outcome: Adequate for Discharge Goal: Will remain free from infection Outcome: Adequate for Discharge Goal: Diagnostic test results will improve Outcome: Adequate for Discharge Goal: Respiratory complications will improve Outcome: Adequate for Discharge Goal: Cardiovascular complication will be avoided Outcome: Adequate for Discharge   Problem: Activity: Goal: Risk for activity intolerance will decrease Outcome: Adequate for Discharge   Problem: Nutrition: Goal: Adequate nutrition will be maintained Outcome: Adequate for Discharge   Problem: Coping: Goal: Level of anxiety will decrease Outcome: Adequate for Discharge   Problem: Elimination: Goal: Will not experience complications related to bowel motility Outcome: Adequate for Discharge Goal: Will not experience complications related to urinary retention Outcome: Adequate for Discharge   Problem: Pain Managment: Goal: General experience of comfort will improve Outcome: Adequate for Discharge   Problem: Safety: Goal: Ability to remain free from injury will improve Outcome: Adequate for Discharge   Problem: Skin Integrity: Goal: Risk for impaired skin integrity will decrease Outcome: Adequate for Discharge   Problem: Education: Goal: Knowledge of disease or condition will improve Outcome: Adequate for Discharge Goal: Knowledge of secondary prevention will improve Outcome: Adequate for Discharge Goal: Knowledge of patient specific risk factors  addressed and post discharge goals established will improve Outcome: Adequate for Discharge Goal: Individualized Educational Video(s) Outcome: Adequate for Discharge   Problem: Health Behavior/Discharge Planning: Goal: Ability to manage health-related needs will improve Outcome: Adequate for Discharge   Problem: Self-Care: Goal: Ability to participate in self-care as condition permits will improve Outcome: Adequate for Discharge   Problem: Ischemic Stroke/TIA Tissue Perfusion: Goal: Complications of ischemic stroke/TIA will be minimized Outcome: Adequate for Discharge

## 2021-07-11 NOTE — Care Management Important Message (Signed)
Important Message  Patient Details  Name: Julian Dunlap. MRN: [id number] Date of Birth: 09-23-1961   Medicare Important Message Given:  Yes     Farhiya Rosten 07/11/2021, 4:31 PM

## 2021-07-11 NOTE — Anesthesia Preprocedure Evaluation (Addendum)
Anesthesia Evaluation  Patient identified by MRN, date of birth, ID band Patient awake    Reviewed: Allergy & Precautions, NPO status , Patient's Chart, lab work & pertinent test results, reviewed documented beta blocker date and time   Airway Mallampati: II  TM Distance: >3 FB Neck ROM: Full    Dental  (+) Dental Advisory Given, Caps, Missing   Pulmonary COPD,  COPD inhaler, Current Smoker and Patient abstained from smoking.,    Pulmonary exam normal breath sounds clear to auscultation       Cardiovascular hypertension, Pt. on medications and Pt. on home beta blockers Normal cardiovascular exam Rhythm:Regular Rate:Normal  Tachycardia   Neuro/Psych Left sided weakness CVA, Residual Symptoms negative psych ROS   GI/Hepatic negative GI ROS, Neg liver ROS,   Endo/Other  Hyperthyroidism Gout  Renal/GU negative Renal ROS  negative genitourinary   Musculoskeletal negative musculoskeletal ROS (+)   Abdominal   Peds  Hematology Lovenox 72m last pm Plavix yesterday   Anesthesia Other Findings   Reproductive/Obstetrics                           Anesthesia Physical Anesthesia Plan  ASA: 3  Anesthesia Plan: MAC   Post-op Pain Management:    Induction: Intravenous  PONV Risk Score and Plan: 0 and Propofol infusion  Airway Management Planned: Natural Airway and Simple Face Mask  Additional Equipment:   Intra-op Plan:   Post-operative Plan:   Informed Consent: I have reviewed the patients History and Physical, chart, labs and discussed the procedure including the risks, benefits and alternatives for the proposed anesthesia with the patient or authorized representative who has indicated his/her understanding and acceptance.     Dental advisory given  Plan Discussed with: CRNA and Anesthesiologist  Anesthesia Plan Comments:         Anesthesia Quick Evaluation

## 2021-07-11 NOTE — Anesthesia Postprocedure Evaluation (Signed)
Anesthesia Post Note  Patient: Julian Dunlap.  Procedure(s) Performed: TRANSESOPHAGEAL ECHOCARDIOGRAM (TEE) BUBBLE STUDY     Patient location during evaluation: PACU Anesthesia Type: MAC Level of consciousness: awake and alert and oriented Pain management: pain level controlled Vital Signs Assessment: post-procedure vital signs reviewed and stable Respiratory status: spontaneous breathing, nonlabored ventilation and respiratory function stable Cardiovascular status: stable and blood pressure returned to baseline Postop Assessment: no apparent nausea or vomiting Anesthetic complications: no   No notable events documented.  Last Vitals:  Vitals:   07/11/21 1009 07/11/21 1105  BP: (!) 141/86 108/71  Pulse: 74 84  Resp: 20 (!) 21  Temp: 36.5 C (!) 36.2 C  SpO2: 91% 95%    Last Pain:  Vitals:   07/11/21 1105  TempSrc: Temporal  PainSc: 0-No pain                 [name]

## 2021-07-11 NOTE — Progress Notes (Signed)
EP service contacted about loop request. Planned for CIR, though not for today We will follow the chart and plan to see him once disposition timing for CIR is known.  Tommye Standard, PA-C

## 2021-07-11 NOTE — Interval H&P Note (Signed)
History and Physical Interval Note:  07/11/2021 10:20 AM  Julian Dunlap.  has presented today for surgery, with the diagnosis of STROKE.  The various methods of treatment have been discussed with the patient and family. After consideration of risks, benefits and other options for treatment, the patient has consented to  Procedure(s): TRANSESOPHAGEAL ECHOCARDIOGRAM (TEE) (N/A) as a surgical intervention.  The patient's history has been reviewed, patient examined, no change in status, stable for surgery.  I have reviewed the patient's chart and labs.  Questions were answered to the patient's satisfaction.     [employer]

## 2021-07-11 NOTE — Progress Notes (Addendum)
    [facility] has been requested to perform a transesophageal echocardiogram on 07/11/21 for stroke.  After careful review of history and examination, the risks and benefits of transesophageal echocardiogram have been explained including risks of esophageal damage, perforation (1:10,000 risk), bleeding, pharyngeal hematoma as well as other potential complications associated with conscious sedation including aspiration, arrhythmia, respiratory failure and death. Alternatives to treatment were discussed, questions were answered. Patient is willing to proceed.   TEE scheduled for 07/11/21 at 11am with Dr. Marlou Porch.  EP notified of possible need for loop recorder if TEE unrevealing.   Of note, patient found to have EF 45% with hypokinesis of the apex. No prior cardiac history. This was discussed with cardiology over the weekend and recommended for outpatient ischemic eval. Patient prefers to follow in our [facility] office. Will send a message to our [facility] schedulers to arrange a new patient appt.    Roby Lofts, PA-C 07/11/2021 9:04 AM

## 2021-07-11 NOTE — Anesthesia Procedure Notes (Signed)
Procedure Name: MAC Date/Time: 07/11/2021 10:40 AM Performed by: Griffin Dakin, CRNA Pre-anesthesia Checklist: Patient identified, Emergency Drugs available, Suction available, Timeout performed and Patient being monitored Patient Re-evaluated:Patient Re-evaluated prior to induction Oxygen Delivery Method: Simple face mask Induction Type: IV induction Placement Confirmation: positive ETCO2 and breath sounds checked- equal and bilateral Dental Injury: Teeth and Oropharynx as per pre-operative assessment

## 2021-07-11 NOTE — Discharge Summary (Addendum)
Name: Julian Dunlap. MRN: [id number] DOB: 1961/09/08 60 y.o. PCP: Pcp, No  Date of Admission: 07/08/2021  9:00 AM Date of Discharge: 07/11/2021 Attending Physician: No att. providers found  Discharge Diagnosis: 1. Acute R ACA CVA 2. Systolic dysfunction with RWMAs 3. Hypertension 4. Hyperlipidemia 5. Tobacco use disorder 6. Pulmonary nodule  Discharge Medications: Allergies as of 07/11/2021       Reactions   Cefazolin Rash, Swelling   Sulfa Antibiotics Other (See Comments)   unknown   Penicillins Rash   Did it involve swelling of the face/tongue/throat, SOB, or low BP? Unknown Did it involve sudden or severe rash/hives, skin peeling, or any reaction on the inside of your mouth or nose? Unknown Did you need to seek medical attention at a hospital or doctor's office? Unknown When did it last happen?  child     If all above answers are "NO", may proceed with cephalosporin use.        Medication List     STOP taking these medications    amLODipine 10 MG tablet Commonly known as: NORVASC   pravastatin 20 MG tablet Commonly known as: PRAVACHOL       TAKE these medications    albuterol 108 (90 Base) MCG/ACT inhaler Commonly known as: VENTOLIN HFA Inhale 2 puffs into the lungs every 6 (six) hours as needed for wheezing or shortness of breath.   aspirin 81 MG EC tablet Take 1 tablet (81 mg total) by mouth daily. Swallow whole. Start taking on: July 12, 2021   atorvastatin 80 MG tablet Commonly known as: LIPITOR Take 1 tablet (80 mg total) by mouth daily. Start taking on: July 12, 2021   clopidogrel 75 MG tablet Commonly known as: PLAVIX Take 1 tablet (75 mg total) by mouth daily for 21 days. Start taking on: July 12, 2021   colchicine 0.6 MG tablet Take 0.6 mg by mouth daily as needed (gout).   Ensure Plus Liqd Take 237 mLs by mouth 2 (two) times daily as needed (nutrition).   losartan 50 MG tablet Commonly known as: COZAAR Take 1  tablet (50 mg total) by mouth daily. Start taking on: July 12, 2021   meclizine 25 MG tablet Commonly known as: ANTIVERT Take 25 mg by mouth 3 (three) times daily as needed for dizziness.   methimazole 5 MG tablet Commonly known as: TAPAZOLE Take 5 mg by mouth daily.   omeprazole 40 MG capsule Commonly known as: PRILOSEC Take 40 mg by mouth daily.   propranolol 10 MG tablet Commonly known as: INDERAL Take 10 mg by mouth 2 (two) times daily.   Symbicort 160-4.5 MCG/ACT inhaler Generic drug: budesonide-formoterol Inhale 2 puffs into the lungs in the morning and at bedtime.   tamsulosin 0.4 MG Caps capsule Commonly known as: FLOMAX Take 0.4 mg by mouth at bedtime.        Disposition and follow-up:   Julian Dunlap. was discharged from [facility] in Stable condition.  At the hospital follow up visit please address:  1.  Acute R ACA CVA: Patient to have DAPT x3 weeks (08/02/2021). Likely embolic in nature, will need loop recorder w/ cardiology. Please ensure patient has an appointment for this.   2.  Systolic dysfunction with RWMA: Given patient had acute stroke, ischemic evaluation deferred to outpatient setting. Will need follow-up with cardiology.  3.  Hypertension: Previously on amlodipine, switched him to losartan given likely heart failure. Can up-titrate as needed, add beta  blocker.  4.  Hyperlipidemia: Patient previously was on pravastatin and did not tolerate well, but has been doing well on atorvastatin in hospital. Please ensure patient is still taking this and tolerating it well. Can consider CoQ10 or ezetemibe if not tolerating.   5.  Tobacco use disorder: While in hospital, patient appeared to be motivated to quit smoking. Please follow-up with this, including discussion of nicotine replacement.  6.  Pulmonary nodule: Will need repeat CT scan in 3-6 months for surveillance.   7.  Labs / imaging needed at time of follow-up: CBC,  BMP  8.  Pending labs/ test needing follow-up: n/a  Follow-up Appointments:  Follow-up Information      [facility] Follow up.   Why: The clinic will call you to schedul an appointment. Contact information: [address] [place] [facility] [place] Follow up.   Specialty: Neurology Why: Please call and make an appointment in one month Contact information: [place], [place] [address] [phone]        [place] [facility] Follow up.   Specialty: Cardiology Why: Please call and make an appointment within 1-2 weeks to place a loop recorder Contact information: [address], [place] Follow up.   Why: The clinic will call you to schedul an appointment. Contact information: [address] [place] [facility] [place] Follow up.   Specialty: Neurology Why: Please call and make an appointment in one month Contact information: [place], [place] [address] [phone]        [place] Office Follow up.   Specialty: Cardiology Why: Please call and make an appointment within 1-2 weeks to place a loop recorder Contact information: [address] [place] by problem list: 1. Acute R ACA CVA: Patient presented to [facility] with left sided weakness. Patient was hemodynamically stable on room air. Neurology was immediately consulted with code stroke and IMTS was consulted for admission. MRI revealed evolving right ACA distribution infarct. At the time patient was outside the window for tPA. Distribution  most consistent with embolic etiology. Further work-up revealed LDL 138, A1c 5.6%. Patient was started on dual antiplatelet therapy and high-intensity statin. TTE revealed hypokinetic apex, EF 45%, left ventricular regional wall motion abnormalities.TEE was also obtained, revealing no thrombus or left atrial appendage, although did reveal very small PFO. Patient was to have loop recorder for further evaluation. In addition, PT/OT had recommended CIR/247 supervision. However, on day of discharge patient was adamant that he could not wait a few more days for a CIR bed. We strongly recommended that Julian Dunlap stay for rehabilitation/reassessment of PT/OT in addition to placement of loop recorder, but patient was not willing to stay another night. Given he has 24/7 supervision and family support at home and the fact that loop recorder can be given outpatient, patient was deemed safe for discharge. Patient to follow-up with [facility], cardiology, and neurology as outpatient.  2. Systolic dysfunction with RWMAs: During routine  work-up for stroke, patient was found to have mildly decreased systolic dysfunction with left ventricular wall abnormalities. Discussed with cardiology, who recommended outpatient ischemic evaluation given acute ischemic evaluation. Patient is aware of this and will make an appointment with the cardiologist. Will also need a loop recorder with EP.  3. Hypertension: Prior to this hospitalization, patient was taking propranolol twice daily and amlodipine. Patient's antihypertensives were stopped upon admission to allow for permissive hypertension. After patient was found to have systolic dysfunction, amlodipine was stopped in lieu of losartan. Will need to have continued follow-up with [facility] for further titration of BP medications.  4. Hyperlipidemia: Patient previously was taking pravastatin. However, patient was not taking this because of side effects. LDL 138. Discussed this with the patient, who  agreed to try another statin. Patient tolerated atorvastatin 80mg  daily in the hospital.   5. Tobacco use disorder: Patient has long history of tobacco use and is still a current user. Discussed the importance of quitting, as this is likely his biggest risk for further cardiovascular events. He seems to be motivated to quit at this time. Will have him follow-up with [facility] for further discussions regarding this.  6. Pulmonary nodule: Incidentally found during stroke work-up. Patient reports he was previously aware of this, but no previous record. Would continue surveillance of this.  Discharge Exam:   BP 114/75 (BP Location: Left Arm)   Pulse 99   Temp 98.1 F (36.7 C) (Oral)   Resp 18   Ht 5\' 11"  (1.803 m)   Wt 74.2 kg   SpO2 97%   BMI 22.81 kg/m  Discharge exam:  General: well-developed, well-nourished, in no acute distress HENT: NCAT Eyes: No scleral icterus, conjunctiva clear CV: No murmurs, regular rate and rhythm Pulm: Clear to auscultation bilaterally, pulmonary effort normal GI: No tenderness, bowel sounds present Skin: warm and dry Neuro:Awake, alert, conversing appropriately at, 5/5 strength in left upper extremity, left lower extremity.  5 out of 5 strength in right upper extremity, right lower extremity. Psych: normal mood and affect  Pertinent Labs, Studies, and Procedures:  CBC Latest Ref Rng & Units 07/09/2021 07/08/2021 07/08/2021  WBC 4.0 - 10.5 K/uL 9.0 - 7.8  Hemoglobin 13.0 - 17.0 g/dL 15.7 15.3 15.2  Hematocrit 39.0 - 52.0 % 47.2 45.0 45.7  Platelets 150 - 400 K/uL 226 - 195   BMP Latest Ref Rng & Units 07/11/2021 07/09/2021 07/08/2021  Glucose 70 - 99 mg/dL 104(H) 89 106(H)  BUN 6 - 20 mg/dL 14 10 17   Creatinine 0.61 - 1.24 mg/dL 0.92 0.98 0.90  Sodium 135 - 145 mmol/L 137 138 141  Potassium 3.5 - 5.1 mmol/L 3.9 3.9 4.0  Chloride 98 - 111 mmol/L 104 103 104  CO2 22 - 32 mmol/L 25 26 -  Calcium 8.9 - 10.3 mg/dL 9.2 9.5 -   Lipid Panel     Component Value  Date/Time   CHOL 185 07/08/2021 0905   TRIG 72 07/08/2021 0905   HDL 33 (L) 07/08/2021 0905   CHOLHDL 5.6 07/08/2021 0905   VLDL 14 07/08/2021 0905   LDLCALC 138 (H) 07/08/2021 0905   A1C 5.6%  CT HEAD WO CONTRAST 07/08/2021 No evidence of acute large vascular territory infarct or acute hemorrhage. ASPECTS is 10. Remote right parietal infarct.  CTA HEAD/NECK 07/08/2021 Occlusion of the distal right ACA, A4 segment. Approximately 15 mL of core infarct in the correlate distal right ACA territory with 15 mL of surrounding penumbra. Left vertebral artery is occluded  at its origin with irregular reconstitution in the upper neck from collaterals. The right vertebral artery appears to largely terminate as PICA with small vertebrobasilar system and prominent bilateral posterior communicating arteries.Irregular nodularity in the right lung apex, measuring up to approximately 1.2 cm. Differential considerations include nodular scarring versus malignancy (particularly given the patient's extensive emphysema). Recommend dedicated chest CT when feasible two fully evaluate the chest.  MRI BRAIN WO CONTRAST 07/08/2021 Acute infarct involving the right parasagittal frontal and parietal lobes as well as portions of the cortex of the right pre and postcentral gyri and body of the corpus callosum, consistent with evolving ACA distribution infarct in the setting of distal ACA occlusion as seen on the prior CTA. Remote right parietal lobe infarct.  CT CHEST W CONTRAST 07/08/2021 Bilateral lower lobe nodules are noted, each measuring approximately 6 mm. Also noted is 10 x 8 mm irregular density in the right lung apex with surrounding reticular densities which most likely represent scarring, but neoplasm or malignancy can not be excluded. Non-contrast chest CT at 3-6 months is recommended. If the nodules are stable at time of repeat CT, then future CT at 18-24 months (from today's scan) is considered optional for  low-risk patients, but is recommended for high-risk patients. This recommendation follows the consensus statement: Guidelines for Management of Incidental Pulmonary Nodules Detected on CT Images: From the Fleischner Society 2017; Radiology 2017; 284:228-243.  ECHOCARDIOGRAM 07/09/2021 The apex is hypokinetic. Echocontrast used, there is swirling contrast in the apex but no clear thrombus. . Left ventricular ejection fraction, by estimation, is 45%. The left ventricle has mildly decreased function. The left ventricle demonstrates  regional wall motion abnormalities (see scoring diagram/findings for description). Left ventricular diastolic parameters are consistent with Grade I diastolic dysfunction (impaired relaxation). Right ventricular systolic function is normal. The right ventricular size is normal. Tricuspid regurgitation signal is inadequate for assessing PA pressure. The mitral valve is normal in structure. No evidence of mitral valve regurgitation. No evidence of mitral stenosis. The aortic valve is tricuspid. Aortic valve regurgitation is not visualized. No aortic stenosis is present. The inferior vena cava is normal in size with greater than 50% respiratory variability, suggesting right atrial pressure of 3 mmHg.   TRANSESOPHAGEAL ECHOCARDIOGRAM 07/11/2021 Left Ventricle: Normal EF 55%, no thrombus Mitral Valve: Normal Aortic Valve: Normal Tricuspid Valve: Normal Left Atrium: Normal, no left atrial appendage thrombus Right Atrium: Normal Intraatrial septum: Mildly redundant  Bubble Contrast Study: Positive late study, very few bubble cross over. Very small PFO.   Discharge Instructions: Discharge Instructions     (HEART FAILURE PATIENTS) Call MD:  Anytime you have any of the following symptoms: 1) 3 pound weight gain in 24 hours or 5 pounds in 1 week 2) shortness of breath, with or without a dry hacking cough 3) swelling in the hands, feet or stomach 4) if you have to sleep on extra  pillows at night in order to breathe.   Complete by: As directed    Call MD for:  difficulty breathing, headache or visual disturbances   Complete by: As directed    Call MD for:  extreme fatigue   Complete by: As directed    Call MD for:  hives   Complete by: As directed    Call MD for:  persistant dizziness or light-headedness   Complete by: As directed    Call MD for:  persistant nausea and vomiting   Complete by: As directed    Call MD for:  redness,  tenderness, or signs of infection (pain, swelling, redness, odor or green/yellow discharge around incision site)   Complete by: As directed    Call MD for:  severe uncontrolled pain   Complete by: As directed    Call MD for:  temperature >100.4   Complete by: As directed    Diet - low sodium heart healthy   Complete by: As directed    Discharge instructions   Complete by: As directed    Julian Dunlap, I am glad you are feeling better and can go home. Please see the following notes:  - For you stroke, you will take aspirin 81mg  daily and clopidogrel (PLAVIX) 75mg  daily for three weeks (Until 08/02/2021). After this please just take aspirin  - For your blood pressure, please stop taking amlodipine. Instead, take losartan (COZAAR) 50mg .  - Our clinic will call you to set up a follow-up appointment.  - Please make sure to follow-up with cardiology for a loop recorder  It was a pleasure meeting you, Julian Dunlap. I wish you the best and hope you stay happy and healthy!  Thank you, Sanjuan Dame, MD   Increase activity slowly   Complete by: As directed       Signed: Christiana Fuchs, DO 07/11/2021, 6:29 PM   Pager: [phone]

## 2021-07-11 NOTE — CV Procedure (Signed)
   Transesophageal Echocardiogram  Indications:Stroke  Time out performed  During this procedure the patient was administered propofol under anesthesiology supervision to achieve and maintain moderate sedation.  The patient's heart rate, blood pressure, and oxygen saturation are monitored continuously during the procedure.   Findings:  Left Ventricle: Normal EF 55%, no thrombus  Mitral Valve: Normal  Aortic Valve: Normal  Tricuspid Valve: Normal  Left Atrium: Normal, no left atrial appendage thrombus  Right Atrium: Normal  Intraatrial septum: Mildly redundant   Bubble Contrast Study: Positive late study, very few bubble cross over. Very small PFO.   Candee Furbish, MD

## 2021-07-11 NOTE — Progress Notes (Signed)
PT Cancellation Note  Patient Details Name: Julian Dunlap. MRN: [id number] DOB: 03-May-1961   Cancelled Treatment:    Reason Eval/Treat Not Completed: Patient at procedure or test/unavailable. Pt currently off unit for TEE. Will check back as schedules allows to continue with PT POC.    Thelma Comp 07/11/2021, 10:38 AM  Rolinda Roan, PT, DPT Acute Rehabilitation Services Pager: [phone] Office: [phone]

## 2021-07-11 NOTE — H&P (View-Only) (Signed)
EP service contacted about loop request. Planned for CIR, though not for today We will follow the chart and plan to see him once disposition timing for CIR is known.  Tommye Standard, PA-C

## 2021-07-11 NOTE — Progress Notes (Signed)
Inpatient Rehabilitation Admissions Coordinator  Inpatient rehab consult received, I spoke with patient by phone to discuss his rehab goals and options. He plans to go home with family who can provide 24/7 assist. He states he is going to bathroom with staff assist and has to pay attention to his left foot when walking. No CIR bed is available today for his admission. I await further progress with therapy to assist with planning if CIR admit is needed vs will he progress to be able to go home with [facility] or OP follow up which he prefers.I will follow up tomorrow. CIR beds limited availability until Thursday.  Danne Baxter, RN, MSN Rehab Admissions Coordinator [phone] 07/11/2021 1:50 PM

## 2021-07-11 NOTE — Transfer of Care (Signed)
Immediate Anesthesia Transfer of Care Note  Patient: Julian Dunlap.  Procedure(s) Performed: TRANSESOPHAGEAL ECHOCARDIOGRAM (TEE) BUBBLE STUDY  Patient Location: Endoscopy Unit  Anesthesia Type:MAC  Level of Consciousness: awake, alert  and oriented  Airway & Oxygen Therapy: Patient Spontanous Breathing and Patient connected to face mask oxygen  Post-op Assessment: Report given to RN and Post -op Vital signs reviewed and stable  Post vital signs: Reviewed and stable  Last Vitals:  Vitals Value Taken Time  BP 108/71 07/11/21 1105  Temp 36.2 C 07/11/21 1105  Pulse 87 07/11/21 1109  Resp 27 07/11/21 1109  SpO2 93 % 07/11/21 1109  Vitals shown include unvalidated device data.  Last Pain:  Vitals:   07/11/21 1105  TempSrc: Temporal  PainSc: 0-No pain         Complications: No notable events documented.

## 2021-07-11 NOTE — Progress Notes (Signed)
  Echocardiogram Echocardiogram Transesophageal has been performed.  Julian Dunlap 07/11/2021, 12:39 PM

## 2021-07-12 ENCOUNTER — Telehealth: Payer: Self-pay | Admitting: *Deleted

## 2021-07-12 ENCOUNTER — Telehealth: Payer: Self-pay | Admitting: Student

## 2021-07-12 DIAGNOSIS — I63421 Cerebral infarction due to embolism of right anterior cerebral artery: Secondary | ICD-10-CM

## 2021-07-12 MED ORDER — BUPROPION HCL ER (SR) 150 MG PO TB12: 150.0000 mg | ORAL_TABLET | Freq: Two times a day (BID) | ORAL | 0 refills | Status: DC

## 2021-07-12 NOTE — Telephone Encounter (Signed)
Called patient to discuss addition of wellbutrin for smoking cessation.  He has tried Chantix in the past and nicotine gum /patch made him sick to his stomach. Patient is actively trying to stop smoking, has not smoked since being discharged yesterday.  Chewing candy to help with cravings currently.  Also placed order for DVT ultrasound.  This was recommended inpatient by neurology team.  Patient was found to have small PFO on echo and stroke thought to be embolic. DVT u/s not completed inpatient due to patient wanting to leave because there was not a bed available for CIR.

## 2021-07-12 NOTE — Telephone Encounter (Signed)
Pt was discharged from the hospital yesterday and will be seen here at our office on 10/6 22 with Dr Collene Gobble. Pt is requesting a rx to stop smoking ; stated he had talked to Dr Collene Gobble when he was in the hospital. Also stated he cannot take Chantix. [facility] [place].

## 2021-07-12 NOTE — Telephone Encounter (Signed)
NEW TOC HFU APPT  Date: 07/21/2021 Status:   Time: 1:30 PM Length: 90  Visit Type: OPEN ESTABLISHED [726]    Provider: Wayland Denis, MD

## 2021-07-13 ENCOUNTER — Encounter (HOSPITAL_COMMUNITY): Payer: Self-pay | Admitting: Cardiology

## 2021-07-13 ENCOUNTER — Other Ambulatory Visit: Payer: Self-pay

## 2021-07-13 ENCOUNTER — Telehealth: Payer: Self-pay | Admitting: *Deleted

## 2021-07-13 ENCOUNTER — Other Ambulatory Visit: Payer: Self-pay | Admitting: Internal Medicine

## 2021-07-13 ENCOUNTER — Ambulatory Visit (INDEPENDENT_AMBULATORY_CARE_PROVIDER_SITE_OTHER): Payer: Medicare Other | Admitting: Internal Medicine

## 2021-07-13 DIAGNOSIS — I63421 Cerebral infarction due to embolism of right anterior cerebral artery: Secondary | ICD-10-CM

## 2021-07-13 DIAGNOSIS — R197 Diarrhea, unspecified: Secondary | ICD-10-CM | POA: Diagnosis not present

## 2021-07-13 NOTE — Telephone Encounter (Signed)
Diarrhea is not a typical side effect of those medications. I see he has colchicine on his list which does cause diarrhea, would tell him to not take that medicine. Otherwise, unclear cause of diarrhea, does not seem to be related to recent hospitalization. Can we set him up for a tele visit this afternoon to investigate more?

## 2021-07-13 NOTE — Progress Notes (Signed)
Patient was recommended to have CIR placement following stroke.  However, patient decided to leave before a bed became available.  Will place orders for home health PT/OT and DME with 5 inch walker.  Patient to follow-up with [facility] on 07/21/2021 with Dr. Vinetta Bergamo.  Also started him on bupropion as he wanted to stop smoking. Had previously tried Chantix, used nictine gum/ patch and did not find them helpful.

## 2021-07-13 NOTE — Progress Notes (Signed)
  [facility] Telephone Encounter Continuity Care Appointment  HPI:  This telephone encounter was created for Julian Dunlap. on 07/13/2021 for the following purpose/cc diarrhea.   Past Medical History:  Past Medical History:  Diagnosis Date   COPD (chronic obstructive pulmonary disease) ([place])    Heart rate fast    Stroke ([place])      ROS:  Negative except as stated in HPI   Assessment / Plan / Recommendations:  Please see A&P under problem oriented charting for assessment of the patient's acute and chronic medical conditions.  As always, pt is advised that if symptoms worsen or new symptoms arise, they should go to an urgent care facility or to to ER for further evaluation.   Consent and Medical Decision Making:  Patient discussed with Dr. Jimmye Norman This is a telephone encounter between Julian Dunlap. and [place] on 07/13/2021 for diarrhea. The visit was conducted with the patient located at home and [place] at [facility]. The patient's identity was confirmed using their DOB and current address. The patient has consented to being evaluated through a telephone encounter and understands the associated risks (an examination cannot be done and the patient may need to come in for an appointment) / benefits (allows the patient to remain at home, decreasing exposure to coronavirus). I personally spent 10 minutes on medical discussion.   Julian Dunlap was recently admitted 9/23-9/26 with left-sided weakness and was noted to have an acute right ACA stroke suspected to be embolic in nature.  During his stroke work-up, patient found to have systolic heart failure with EF 45% and apical hypokinesis.  Follow-up TEE negative for thrombus but did note a small PFO.  Patient was started on dual antiplatelet therapy and high intensity statin.  Further work-up for his heart failure was deferred to outpatient.  Patient was recommended for CIR; however, discharged to home  per patient request.  Patient was discharged on dual antiplatelet therapy, high intensity statin, losartan, colchicine, methimazole, propanolol, tamsulosin and omeprazole.  He was also discharged on Wellbutrin for tobacco cessation; however, has not been able to pick this up yet. Patient presented as a telehealth appointment for evaluation of diarrhea.  He reports that he ate beans and callers the night prior to which he started having diarrhea.  Reports the diarrhea as being "pure water" with multiple episodes throughout the night.  Denies any melena or hematochezia.  He denied any associate abdominal pain, nausea/vomiting.  He does report that this has improved since then and most recent stools have been solid. He does have continued left-sided numbness and is using a walker to assist with ambulation. Suspect that patient's episode of diarrhea was secondary to diet.  Patient advised to continue all medications at this time.  Patient has a hospital follow-up appointment on 10/3.  Advised patient to follow-up.

## 2021-07-13 NOTE — Telephone Encounter (Signed)
Patient seen on IMTS with d/c on 9/26. HFU scheduled for 10/6. Stated he started 4 new meds (ASA, Atorvastatin, plavix, and losartan) 2 nights ago. Last night began with diarrhea. Now he states he is going every 5-10 minutes and it is "pure water." States he is drinking plenty of water to stay hydrated. Offered appt this PM but patient states he has no transportation. Please advise.

## 2021-07-13 NOTE — Telephone Encounter (Signed)
Patient called and informed of Dr. Autumn Patty instructions.  He is agreeable to having a telehealth appt this afternoon.  Opening at 1545 on Dr. Margy Clarks schedule, patient added.   Pt also states he did not receive his RX for the Wellbutrin yesterday.  RX was sent via [facility], but was in print mode.  Will forward to Dr. Howie Ill, please change mode to normal and resend RX. Thank you, SChaplin, RN,BSN

## 2021-07-14 ENCOUNTER — Telehealth: Payer: Self-pay

## 2021-07-14 MED ORDER — BUPROPION HCL ER (SR) 150 MG PO TB12: 150.0000 mg | ORAL_TABLET | Freq: Two times a day (BID) | ORAL | 0 refills | Status: DC

## 2021-07-14 NOTE — Telephone Encounter (Signed)
Return pt's call - stated he has been having right sided h/a's; stated he wanted to inform the doctor. Stated the ha/s's are in the same area as when he had them last year in July. Discharged from hospital 9/26. He had telehealth appt yesterday with Dr Marva Panda. HFU appt changed from next Thursday to Monday 10/3 @ 1415 PM with Dr Allyson Sabal. Instructed to go to ED if symptoms worsen.

## 2021-07-14 NOTE — Telephone Encounter (Signed)
Updated Wellbutrin Rx to normal mode.

## 2021-07-14 NOTE — Telephone Encounter (Signed)
Requesting to speak with a nurse about having pain on top of his head. Please call pt back.

## 2021-07-15 ENCOUNTER — Telehealth: Payer: Self-pay | Admitting: *Deleted

## 2021-07-15 MED ORDER — BUPROPION HCL ER (SR) 150 MG PO TB12: ORAL_TABLET | ORAL | 0 refills | Status: AC

## 2021-07-15 NOTE — Telephone Encounter (Signed)
Patient states he needs the med to help him stop smoking. Buproprion was written yesterday but was in Print mode. Please resend as Normal. Thank you.

## 2021-07-15 NOTE — Telephone Encounter (Signed)
Done

## 2021-07-18 ENCOUNTER — Ambulatory Visit (INDEPENDENT_AMBULATORY_CARE_PROVIDER_SITE_OTHER): Payer: Medicare Other | Admitting: Student

## 2021-07-18 ENCOUNTER — Encounter: Payer: Self-pay | Admitting: Student

## 2021-07-18 ENCOUNTER — Other Ambulatory Visit: Payer: Self-pay

## 2021-07-18 VITALS — BP 129/77 | HR 75 | Temp 98.1°F | Resp 28 | Ht 71.0 in | Wt 158.9 lb

## 2021-07-18 DIAGNOSIS — I63421 Cerebral infarction due to embolism of right anterior cerebral artery: Secondary | ICD-10-CM

## 2021-07-18 DIAGNOSIS — Q2112 Patent foramen ovale: Secondary | ICD-10-CM

## 2021-07-18 DIAGNOSIS — Z8673 Personal history of transient ischemic attack (TIA), and cerebral infarction without residual deficits: Secondary | ICD-10-CM | POA: Diagnosis not present

## 2021-07-18 DIAGNOSIS — E782 Mixed hyperlipidemia: Secondary | ICD-10-CM | POA: Diagnosis not present

## 2021-07-18 DIAGNOSIS — J449 Chronic obstructive pulmonary disease, unspecified: Secondary | ICD-10-CM | POA: Diagnosis not present

## 2021-07-18 DIAGNOSIS — I10 Essential (primary) hypertension: Secondary | ICD-10-CM | POA: Diagnosis not present

## 2021-07-18 DIAGNOSIS — E559 Vitamin D deficiency, unspecified: Secondary | ICD-10-CM | POA: Diagnosis not present

## 2021-07-18 DIAGNOSIS — R7303 Prediabetes: Secondary | ICD-10-CM | POA: Diagnosis not present

## 2021-07-18 DIAGNOSIS — Z720 Tobacco use: Secondary | ICD-10-CM | POA: Diagnosis not present

## 2021-07-18 NOTE — Progress Notes (Signed)
   CC: hospital f/u of stroke  HPI:  Mr.Julian Dunlap. is a 60 y.o. male with history listed below presenting to the [facility] for hospital f/u of stroke. Please see individualized problem based charting for full HPI.  Past Medical History:  Diagnosis Date   COPD (chronic obstructive pulmonary disease) ([place])    Heart rate fast    Stroke [facility])     Review of Systems:  Negative aside from that listed in individualized problem based charting.  Physical Exam:  Vitals:   07/18/21 1408  BP: 129/77  Pulse: 75  Resp: (!) 28  Temp: 98.1 F (36.7 C)  TempSrc: Oral  SpO2: 97%  Weight: 158 lb 14.4 oz (72.1 kg)  Height: 5\' 11"  (1.803 m)   Physical Exam Constitutional:      Appearance: Normal appearance. He is not ill-appearing.  HENT:     Head: Normocephalic and atraumatic.     Mouth/Throat:     Mouth: Mucous membranes are moist.     Pharynx: Oropharynx is clear. No oropharyngeal exudate.  Eyes:     Extraocular Movements: Extraocular movements intact.     Conjunctiva/sclera: Conjunctivae normal.     Pupils: Pupils are equal, round, and reactive to light.  Cardiovascular:     Rate and Rhythm: Normal rate and regular rhythm.     Pulses: Normal pulses.     Heart sounds: Normal heart sounds. No murmur heard.   No friction rub. No gallop.  Pulmonary:     Effort: Pulmonary effort is normal.     Breath sounds: Normal breath sounds. No wheezing, rhonchi or rales.  Abdominal:     General: Bowel sounds are normal. There is no distension.     Palpations: Abdomen is soft.     Tenderness: There is no abdominal tenderness.  Musculoskeletal:        General: No swelling. Normal range of motion.     Cervical back: Normal range of motion. No rigidity.  Skin:    General: Skin is warm and dry.  Neurological:     Mental Status: He is alert and oriented to person, place, and time.     Comments: Numbness over left auricle of ear. Strength 5/5 in RLE and RUE, 4/5 in LUE and LLE. Coordination  intact bilaterally.   Psychiatric:        Mood and Affect: Mood normal.        Behavior: Behavior normal.     Assessment & Plan:   See Encounters Tab for problem based charting.  Patient discussed with Dr. Dareen Piano

## 2021-07-18 NOTE — Assessment & Plan Note (Signed)
Patient with history of acute R ACA stroke about 10 days ago (07/08/2021). He was discharged from [facility] with plans for DAPT x3 weeks, outpatient neuro f/u, and outpatient cardiology f/u for loop recorder.   He reports overall improvement in symptoms from stroke (numbness remains over auricle of left ear, but does have improvement in weakness on left side). He has transitioned from using a walker to using a cane with good balance.   He has outpatient f/u with cardiology on November 7th, 2022 for loop recorder placement. I placed referral to [facility] for outpatient neuro evaluation to adjust medications as needed. His last day of DAPT is 08/02/2021, so will need plavix removed and continuation of ASA.  Otherwise, placed order for outpatient LE venous dopplers to evaluate for DVT given that he had an ischemic stroke and was found to have a small PFO on TEE.  Plan: -continue DAPT until 08/02/2021, then ASA alone -f/u with cardiology on 11/7 for loop recorder placement -f/u with [facility] for further management of stroke/medications -outpatient venous dopplers to r/o DVT -f/u in 3 months

## 2021-07-18 NOTE — Patient Instructions (Signed)
[name].,  It was a pleasure seeing you in the clinic today.   Please continue taking your aspirin and plavix daily. I have referred you to Neurology for follow up of your stroke. Someone will call you to schedule an appointment for your lower leg ultrasound to make sure you do not have any blood clots. Please make sure to go to your appointment with Cardiology on August 22, 2021.  Please call our clinic at [phone] if you have any questions or concerns. The best time to call is Monday-Friday from 9am-4pm, but there is someone available 24/7 at the same number. If you need medication refills, please notify your pharmacy one week in advance and they will send [place] a request.   Thank you for letting us take part in your care. We look forward to seeing you next time!

## 2021-07-19 NOTE — Progress Notes (Signed)
Internal Medicine Clinic Attending  Case discussed with Dr. Jinwala  At the time of the visit.  We reviewed the resident's history and exam and pertinent patient test results.  I agree with the assessment, diagnosis, and plan of care documented in the resident's note.  

## 2021-07-21 ENCOUNTER — Other Ambulatory Visit: Payer: Self-pay

## 2021-07-21 ENCOUNTER — Encounter: Payer: Medicare Other | Admitting: Internal Medicine

## 2021-07-21 ENCOUNTER — Ambulatory Visit (HOSPITAL_COMMUNITY)
Admission: RE | Admit: 2021-07-21 | Discharge: 2021-07-21 | Disposition: A | Discharge status: Home / Self Care | Payer: Medicare Other | Source: Ambulatory Visit | Attending: Internal Medicine | Admitting: Internal Medicine

## 2021-07-21 DIAGNOSIS — Q2112 Patent foramen ovale: Secondary | ICD-10-CM | POA: Insufficient documentation

## 2021-07-21 DIAGNOSIS — I63421 Cerebral infarction due to embolism of right anterior cerebral artery: Secondary | ICD-10-CM | POA: Diagnosis not present

## 2021-07-22 DIAGNOSIS — I10 Essential (primary) hypertension: Secondary | ICD-10-CM | POA: Diagnosis not present

## 2021-07-22 DIAGNOSIS — R0603 Acute respiratory distress: Secondary | ICD-10-CM | POA: Diagnosis not present

## 2021-07-22 DIAGNOSIS — Z87891 Personal history of nicotine dependence: Secondary | ICD-10-CM | POA: Diagnosis not present

## 2021-07-22 DIAGNOSIS — J449 Chronic obstructive pulmonary disease, unspecified: Secondary | ICD-10-CM | POA: Diagnosis not present

## 2021-07-22 DIAGNOSIS — Z886 Allergy status to analgesic agent status: Secondary | ICD-10-CM | POA: Diagnosis not present

## 2021-07-22 DIAGNOSIS — Z79899 Other long term (current) drug therapy: Secondary | ICD-10-CM | POA: Diagnosis not present

## 2021-07-22 DIAGNOSIS — Z888 Allergy status to other drugs, medicaments and biological substances status: Secondary | ICD-10-CM | POA: Diagnosis not present

## 2021-07-22 DIAGNOSIS — E7800 Pure hypercholesterolemia, unspecified: Secondary | ICD-10-CM | POA: Diagnosis not present

## 2021-07-22 DIAGNOSIS — Z882 Allergy status to sulfonamides status: Secondary | ICD-10-CM | POA: Diagnosis not present

## 2021-07-22 DIAGNOSIS — Z8673 Personal history of transient ischemic attack (TIA), and cerebral infarction without residual deficits: Secondary | ICD-10-CM | POA: Diagnosis not present

## 2021-07-22 DIAGNOSIS — Z880 Allergy status to penicillin: Secondary | ICD-10-CM | POA: Diagnosis not present

## 2021-07-22 DIAGNOSIS — L309 Dermatitis, unspecified: Secondary | ICD-10-CM | POA: Diagnosis not present

## 2021-07-25 ENCOUNTER — Encounter: Payer: Medicare Other | Admitting: Internal Medicine

## 2021-07-27 ENCOUNTER — Telehealth: Payer: Self-pay

## 2021-07-27 NOTE — Telephone Encounter (Signed)
Requesting to speak with a nurse about med refill. Pt states he still have the rash on his body. Please call pt back.

## 2021-07-27 NOTE — Telephone Encounter (Signed)
RTC, States he went to [facility] ED on 10/7 and was given steroids for a rash.  States the rash seemed to be clearing up some, but last night it starting getting worse again.  C/O rash on torso, denies itching.  He is asking for more steroids.  RN advised patient he will need to be seen in clinic for in-person evaluation.  Appt offered this afternoon, pt declined d/t transportation issues.  An appt was scheduled for tomorrow, 10/13 @ 1415 with Dr. Allyson Sabal.  Will also route this message to office manager regarding PCP assignment Thank you, SChaplin, RN,BSN

## 2021-07-28 ENCOUNTER — Other Ambulatory Visit: Payer: Self-pay

## 2021-07-28 ENCOUNTER — Encounter: Payer: Self-pay | Admitting: Student

## 2021-07-28 ENCOUNTER — Ambulatory Visit (INDEPENDENT_AMBULATORY_CARE_PROVIDER_SITE_OTHER): Payer: Medicare Other | Admitting: Student

## 2021-07-28 DIAGNOSIS — T7840XA Allergy, unspecified, initial encounter: Secondary | ICD-10-CM

## 2021-07-28 DIAGNOSIS — T50915A Adverse effect of multiple unspecified drugs, medicaments and biological substances, initial encounter: Secondary | ICD-10-CM

## 2021-07-28 DIAGNOSIS — I63421 Cerebral infarction due to embolism of right anterior cerebral artery: Secondary | ICD-10-CM

## 2021-07-28 DIAGNOSIS — L271 Localized skin eruption due to drugs and medicaments taken internally: Secondary | ICD-10-CM

## 2021-07-28 MED ORDER — TRIAMCINOLONE ACETONIDE 0.5 % EX OINT: 1.0000 "application " | TOPICAL_OINTMENT | Freq: Two times a day (BID) | CUTANEOUS | 0 refills | Status: AC

## 2021-07-28 NOTE — Assessment & Plan Note (Signed)
Patient has completed three weeks of DAPT for his acute R ACA stroke. Given drug reaction (see other note), will stop plavix at this time and will stop losartan given adequate BP control. Will continue lipitor and ASA for continued secondary stroke prevention.  Plan: -continue aspirin and lipitor -stop plavix and losartan -f/u with [facility]

## 2021-07-28 NOTE — Patient Instructions (Signed)
Mr. Bowman Higbie.,  It was a pleasure seeing you in the clinic today.   Your rash is likely from an allergic reaction to one of the medications you were started on after your hospitalization. We have stopped two of your medications (Plavix and Losartan). We expect that your rash will improve within the next week or two. I have also prescribed a steroid cream to help with your itching. Please pick this up from your pharmacy. If at any time, you become very short of breath, please call 911 to obtain help.  Please call our clinic at [phone] if you have any questions or concerns. The best time to call is Monday-Friday from 9am-4pm, but there is someone available 24/7 at the same number. If you need medication refills, please notify your pharmacy one week in advance and they will send [place] a request.   Thank you for letting us take part in your care. We look forward to seeing you next time!

## 2021-07-28 NOTE — Assessment & Plan Note (Signed)
Patient with about a 3 week history of a rash that began on arms and extending to chest, back, abdomen, and thighs. Reports that rash began after last hospitalization, where he was admitted for stroke, and began to worsen a couple of days after discharge. Denies any respiratory distress over the past few weeks or any new changes to his home environment (no new supplements, shampoos, detergents, etc). States that it started after he began taking his new medications, which include aspirin, plavix, lipitor, and losartan.  He was seen in the ED for this recently, who suspected this was a contact dermatitis or eczema. He was prescribed a 5-day course of systemic steroids along with hydroxyzine for itching. States that the steroids provided minimal improvement and his rash went back to how it was after stopping steroids.  On exam, patient's rash is maculopapular with areas of confluence, predominantly over chest. It is blanching and nonpalpable, so low suspicion for a vasculitis at this time. It is pruritic at times, but not currently.  Overall, patient with sudden-onset maculopapular rash that began after starting new medications and is unresponsive to systemic steroids. Most likely, this is an exanthematous drug eruption in response to one of his newly started medications. On review, there have been documented cases of this with aspirin, plavix, and losartan.   Since patient has completed 3 weeks of DAPT, will continue aspirin therapy and stop plavix at this time. Given adequate BP control, will also stop losartan at this time. I have prescribed topical triamcinolone ointment to help with pruritis. Will follow up with patient at 2-week telehealth visit to ensure resolution or improvement of rash. If no improvement, will consider stopping aspirin and instead restarting plavix to see if drug reaction is from aspirin.  Plan: -stopped losartan and plavix -continue aspirin and lipitor -triamcinolone ointment for  pruritis -f/u in 2-weeks with telehealth visit

## 2021-07-28 NOTE — Progress Notes (Signed)
   CC: rash   HPI:  Mr.Julian Dunlap. is a 60 y.o. male with history listed below presenting to the [facility] for a rash on his chest, stomach, back, arms, and thighs. Please see individualized problem based charting for full HPI.  Past Medical History:  Diagnosis Date   COPD (chronic obstructive pulmonary disease) ([place])    Heart rate fast    Stroke [facility])     Review of Systems:  Negative aside from that listed in individualized problem based charting.  Physical Exam:  Vitals:   07/28/21 1324  BP: 125/72  Pulse: 75  Resp: (!) 28  Temp: 98.9 F (37.2 C)  TempSrc: Oral  SpO2: 97%  Weight: 162 lb (73.5 kg)  Height: 6\' 1"  (1.854 m)   Physical Exam Constitutional:      Appearance: Normal appearance. He is not ill-appearing.  HENT:     Nose: Nose normal. No congestion.     Mouth/Throat:     Mouth: Mucous membranes are moist.     Pharynx: Oropharynx is clear. No oropharyngeal exudate or posterior oropharyngeal erythema.  Eyes:     Extraocular Movements: Extraocular movements intact.     Conjunctiva/sclera: Conjunctivae normal.     Pupils: Pupils are equal, round, and reactive to light.  Cardiovascular:     Rate and Rhythm: Normal rate and regular rhythm.     Pulses: Normal pulses.     Heart sounds: Normal heart sounds. No murmur heard.   No friction rub. No gallop.  Pulmonary:     Effort: Pulmonary effort is normal.     Breath sounds: Normal breath sounds. No wheezing, rhonchi or rales.  Abdominal:     General: Bowel sounds are normal. There is no distension.     Palpations: Abdomen is soft.     Tenderness: There is no abdominal tenderness.  Musculoskeletal:        General: No swelling. Normal range of motion.     Cervical back: Normal range of motion.  Skin:    General: Skin is warm and dry.     Comments: Mild maculopapular rash over bilateral upper arms, back, chest, abdomen, and bilateral thighs, most predominantly over chest. It is blanching, nontender,  nonpalpable, and confluent in some areas.  Neurological:     Mental Status: He is alert and oriented to person, place, and time. Mental status is at baseline.  Psychiatric:        Mood and Affect: Mood normal.        Behavior: Behavior normal.     Assessment & Plan:   See Encounters Tab for problem based charting.  Patient seen with Dr. Evette Doffing

## 2021-07-29 NOTE — Progress Notes (Signed)
Internal Medicine Clinic Attending  I saw and evaluated the patient.  I personally confirmed the key portions of the history and exam documented by Dr. Jinwala and I reviewed pertinent patient test results.  The assessment, diagnosis, and plan were formulated together and I agree with the documentation in the resident's note.  

## 2021-08-01 NOTE — Progress Notes (Signed)
Internal Medicine Clinic Attending ° °Case discussed with Dr. Aslam  At the time of the visit.  We reviewed the resident’s history and exam and pertinent patient test results.  I agree with the assessment, diagnosis, and plan of care documented in the resident’s note.  °

## 2021-08-02 ENCOUNTER — Ambulatory Visit: Payer: Medicare Other | Admitting: Diagnostic Neuroimaging

## 2021-08-03 ENCOUNTER — Other Ambulatory Visit: Payer: Self-pay | Admitting: *Deleted

## 2021-08-03 NOTE — Patient Outreach (Signed)
[facility]) Care Management  08/03/2021  Cahlil Sattar [birth year] [id number]   [facility] outreach for EMMI-stroke  Mr Rathana Viveros was referred to [facility] on 07/29/21 for  RED ON EMMI ALERT Day #1           Date: 07/28/21 1000 [place] Reason: Questions/problems with meds? Yes   Insurance: Medicare/Medicaid of [facility] admissions x 1 ED visits x 3 in the last 6 months   [facility] Unsuccessful outreach   Outreach attempt to the home number  [phone] No answer. [facility] RN CM left HIPAA [facility] Portability and Accountability Act) compliant voicemail message along with CM's contact info.   Plan: [facility] RN CM scheduled this patient for another call attempt within 4-7 business days Unsuccessful outreach letter sent on 08/03/21 Unsuccessful outreach on 08/03/21   Joelene Millin L. Lavina Hamman, RN, BSN, [place] Coordinator Office number [phone] Mobile number [phone]  Main [facility] number [phone] Fax number [phone]

## 2021-08-08 ENCOUNTER — Telehealth: Payer: Self-pay | Admitting: *Deleted

## 2021-08-08 NOTE — Telephone Encounter (Signed)
Abelina Bachelor  [place], [place], [place]; [place], [place]; [place]y [place]; [place], [place]; [place], [place]; 1 other  GM Mamie,  This pt. was a No-Admitt.  Below is the note from the Physical Therapist dated 10/14;   PATIENT WAS AT [place]. THIS MORNING PATIENT SAID THAT HE WAS TAKING OFF SEVERAL MEDICATIONS, HE WAS BACK TO NORMAL WALKING WELL, DRIVING, DOING FINE BACK TO HIS NORMAL LIFE. HE SAYS HE DOESN'T NEED ANY PHYSICAL THERAPY SERVICES AT THIS TIME. PT TRIED TO CONTACT PRIMARY CARE PHYSICIAN TO NOTIFY OF NON PICKUP BUT OFFICE WAS CLOSED. WILL CHECK LATER.    ----- Message -----  From: Judge Stall, NT  Sent: 08/02/2021   9:30 AM EDT  To: Patsy [place], [place] Ram, *  Subject: follow up                                       Good morning following up on this patient to see if youu all were able to get in touch with him,if so would you have a SOC date for me [place], [place] C10/18/20229:30 AM

## 2021-08-09 DIAGNOSIS — Z23 Encounter for immunization: Secondary | ICD-10-CM | POA: Diagnosis not present

## 2021-08-10 ENCOUNTER — Other Ambulatory Visit: Payer: Self-pay | Admitting: *Deleted

## 2021-08-10 NOTE — Patient Outreach (Addendum)
[place] [facility]) Care Management  08/10/2021  Zeno Hickel 1961/08/24 [id number]   THN second unsuccessful outreach for EMMI-stroke   Mr Dayshawn Irizarry was referred to [facility] on 07/29/21 for  RED ON EMMI ALERT Day #1           Date: 07/28/21 1000 Red Alert Reason: Questions/problems with meds? Yes     Insurance: Medicare/Medicaid of [place] [facility] admissions x 1 ED visits x 3 in the last 6 months    THN Unsuccessful outreach     Outreach attempt to the home number  [phone] No answer. [facility]N RN CM left HIPAA [facility] Portability and Accountability Act) compliant voicemail message along with CM's contact info.    Plan: [facility] RN CM scheduled this patient for another call attempt within 4-7 business days Unsuccessful outreach letter sent on 08/03/21 Unsuccessful outreach on 08/03/21, 08/10/21     Joelene Millin L. Lavina Hamman, RN, BSN, [place] Coordinator Office number [phone] Mobile number [phone]  Main [facility] number [phone] Fax number [phone]

## 2021-08-11 ENCOUNTER — Ambulatory Visit (INDEPENDENT_AMBULATORY_CARE_PROVIDER_SITE_OTHER): Payer: Medicare Other | Admitting: Student

## 2021-08-11 DIAGNOSIS — T7840XA Allergy, unspecified, initial encounter: Secondary | ICD-10-CM

## 2021-08-11 NOTE — Progress Notes (Signed)
Attempted to call patient x3 and left one voicemail, but unable to reach patient.

## 2021-08-15 ENCOUNTER — Other Ambulatory Visit: Payer: Self-pay | Admitting: *Deleted

## 2021-08-15 ENCOUNTER — Encounter: Payer: Self-pay | Admitting: *Deleted

## 2021-08-15 ENCOUNTER — Other Ambulatory Visit: Payer: Self-pay

## 2021-08-15 DIAGNOSIS — K219 Gastro-esophageal reflux disease without esophagitis: Secondary | ICD-10-CM | POA: Insufficient documentation

## 2021-08-15 DIAGNOSIS — Z720 Tobacco use: Secondary | ICD-10-CM | POA: Insufficient documentation

## 2021-08-15 DIAGNOSIS — E782 Mixed hyperlipidemia: Secondary | ICD-10-CM | POA: Insufficient documentation

## 2021-08-15 DIAGNOSIS — E559 Vitamin D deficiency, unspecified: Secondary | ICD-10-CM | POA: Insufficient documentation

## 2021-08-15 DIAGNOSIS — J449 Chronic obstructive pulmonary disease, unspecified: Secondary | ICD-10-CM | POA: Insufficient documentation

## 2021-08-15 DIAGNOSIS — R7303 Prediabetes: Secondary | ICD-10-CM | POA: Insufficient documentation

## 2021-08-15 DIAGNOSIS — I10 Essential (primary) hypertension: Secondary | ICD-10-CM | POA: Insufficient documentation

## 2021-08-15 DIAGNOSIS — K5730 Diverticulosis of large intestine without perforation or abscess without bleeding: Secondary | ICD-10-CM | POA: Insufficient documentation

## 2021-08-15 NOTE — Patient Outreach (Signed)
[facility]) Care Management  08/15/2021  Julian Dunlap 08/22/1961 [id number]   [facility] outreach for EMMI-stroke patient   Julian Julian Dunlap was referred to [facility] on 07/29/21 for  RED ON EMMI ALERT Day #1           Date: 07/28/21 1000 Red Alert Reason: Questions/problems with meds? Yes     Insurance: Medicare/Medicaid of  [facility] admissions x 1 ED visits x 3 in the last 6 months   EMMI Julian Dunlap questions answered about the use of anticoagulants and teeth extraction He has completed his ordered Plavix  He was encouraged to review his medication list with his dentist to include his aspirin and he would be instructed on how many days to hold aspirin prior to teeth extraction. He will make his dental extraction appointment He thanked RN CM for outreach With assessment of other needs he denied concerns today and agrees to follow up once more prior to closure of EMMI stroke case closure  "I got all I need right now"  Patient Active Problem List   Diagnosis Date Noted   Chronic obstructive pulmonary disease, unspecified ([place]) 08/15/2021   Diverticular disease of colon 08/15/2021   Essential hypertension 08/15/2021   Gastroesophageal reflux disease 08/15/2021   Mixed hyperlipidemia 08/15/2021   Prediabetes 08/15/2021   Tobacco abuse 08/15/2021   Vitamin D deficiency 08/15/2021   Allergic drug reaction 07/28/2021   CVA (cerebral vascular accident) [facility]) 07/08/2021   Hyperthyroidism 08/01/2017   Plan Patient agrees to care plan and follow up within the next 14 business days    Julian Dunlap L. Lavina Hamman, RN, BSN, [place] Coordinator Office number [phone] Main [facility] number [phone] Fax number [phone]

## 2021-08-16 DIAGNOSIS — K047 Periapical abscess without sinus: Secondary | ICD-10-CM | POA: Diagnosis not present

## 2021-08-16 DIAGNOSIS — R7303 Prediabetes: Secondary | ICD-10-CM | POA: Diagnosis not present

## 2021-08-16 DIAGNOSIS — Z720 Tobacco use: Secondary | ICD-10-CM | POA: Diagnosis not present

## 2021-08-16 DIAGNOSIS — E782 Mixed hyperlipidemia: Secondary | ICD-10-CM | POA: Diagnosis not present

## 2021-08-16 DIAGNOSIS — Z8673 Personal history of transient ischemic attack (TIA), and cerebral infarction without residual deficits: Secondary | ICD-10-CM | POA: Diagnosis not present

## 2021-08-16 DIAGNOSIS — E559 Vitamin D deficiency, unspecified: Secondary | ICD-10-CM | POA: Diagnosis not present

## 2021-08-16 DIAGNOSIS — J449 Chronic obstructive pulmonary disease, unspecified: Secondary | ICD-10-CM | POA: Diagnosis not present

## 2021-08-16 DIAGNOSIS — I10 Essential (primary) hypertension: Secondary | ICD-10-CM | POA: Diagnosis not present

## 2021-08-18 ENCOUNTER — Ambulatory Visit: Payer: Medicare Other | Admitting: Cardiology

## 2021-08-22 ENCOUNTER — Other Ambulatory Visit: Payer: Self-pay | Admitting: *Deleted

## 2021-08-22 ENCOUNTER — Other Ambulatory Visit: Payer: Self-pay

## 2021-08-22 NOTE — Patient Outreach (Addendum)
[facility]) Care Management  08/22/2021  Julian Dunlap 17-Apr-1961 [id number]   [facility] outreach for EMMI-stroke patient   Julian Dunlap was referred to [facility] on 07/29/21 for  RED ON EMMI ALERT Day #1           Date: 07/28/21 1000 Red Alert Reason: Questions/problems with meds? Yes     Insurance: Medicare/Medicaid of [place], has enrolled in united healthcare advantage plan to be come active on 10/16/2021 [facility] x 1 ED visits x 3 in the last 6 months   Julian Dunlap verified his HIPAA identifiers  Assessment Julian Deaton reports he is doing very well He denies any worsening symptoms, needs at this time when assessed He states he still has not been able to obtain a medicaid dentist RN CM assisted by going to the [facility] medicaid site to locate a list of in network accepting dentist near his zip code  He was provided with the outreach numbers for these facilities ([phone] [address] [place], [phone] [address] [place], [address] [place]) He agrees to outreach to the facilities and to call RN CM to update RN CM    Plans  Patient agrees to care plan and he will follow up within the next 5 business days He agrees to case closure if no further assistance is needed for dentists  Goals Addressed               This Visit's Progress     Patient Stated     Find Help in My Community [facility]) (pt-stated)   On track     Timeframe:  Short-Term Goal Priority:  High Start Date:                     08/22/21        Expected End Date:          08/26/21             Follow Up Date 08/26/21 Barriers: Knowledge    - begin a notebook of services in my neighborhood or community - follow-up on any referrals for help I am given     Notes:  08/22/21 need of dentist.Permission given for RN CM to assist with a list of in network providers from online source. He will outreach and let RN CM know if he is able to obtain an appointment by  08/26/21         Joelene Millin L. Lavina Hamman, RN, BSN, [employer] Coordinator Office number [phone] Main [facility] number [phone] Fax number [phone]

## 2021-08-26 ENCOUNTER — Encounter: Payer: Self-pay | Admitting: *Deleted

## 2021-08-26 ENCOUNTER — Other Ambulatory Visit: Payer: Self-pay

## 2021-08-26 ENCOUNTER — Other Ambulatory Visit: Payer: Self-pay | Admitting: *Deleted

## 2021-08-26 NOTE — Patient Outreach (Signed)
[facility]) Care Management  08/26/2021  Julian Dunlap. Dec 21, 1960 [id number]   [facility] EMMI case closure   Mr Theophile Harvie was referred to [facility] on 07/29/21 for  RED ON EMMI ALERT Day #1           Date: 07/28/21 1000 Red Alert Reason: Questions/problems with meds? Yes     Insurance: Medicare/Medicaid of [place], has enrolled in united healthcare advantage plan to be come active on 10/16/2021 [facility] admissions x 1 ED visits x 3 in the last 6 months    Initially RN Cm completed an Unsuccessful outreach to Mr Sadao Weyer attempt to the listed at the preferred outreach number in [place] No answer. THN RN CM left HIPAA [facility] Portability and Accountability Act) compliant voicemail message along with CM's contact info.   Mr Wile shortly afterwards returned the call  He is able to verify his HIPAA identifiers   Goals Addressed               This Visit's Progress     Patient Stated     COMPLETED: Find Help in My Community [facility]) (pt-stated)   On track     Timeframe:  Short-Term Goal Priority:  High Start Date:                     08/22/21        Expected End Date:          08/26/21             Goal completed 08/26/21 Barriers: Knowledge    - begin a notebook of services in my neighborhood or community - follow-up on any referrals for help I am given     Notes:  08/26/21 He confirms continued improvements and no further red alert issues He has scheduled a dental appointment  for Tuesday 09/06/21 at [facility] with Dr Laurelyn Sickle [phone]) New pcp will be [place] turner in [place] [facility] on 09/27/21  08/22/21 need of dentist.Permission given for RN CM to assist with a list of in network providers from online source. He will outreach and let RN CM know if he is able to obtain an appointment by 08/26/21          Plan: [facility] RN CM completed EMMI case closure No further identified needs Patient  appreciative of services rendered Pt encouraged to return a call to [facility] RN CM prn   Josecarlos Harriott L. Lavina Hamman, RN, BSN, [place] Coordinator Office number [phone] Mobile number [phone]  Main [facility] number [phone] Fax number [phone]

## 2021-09-07 ENCOUNTER — Ambulatory Visit: Payer: Medicare Other | Admitting: Cardiology

## 2021-09-07 NOTE — Progress Notes (Deleted)
Electrophysiology Office Note:    Date:  09/07/2021   ID:  Julian Marten., DOB 21-Nov-1960, MRN [id number]  PCP:  Julian Kroner, DO  [facility] Cardiologist:  None  [facility] Electrophysiologist:  None   Referring MD: Julian Axe, MD   Chief Complaint: CVA  History of Present Illness:    Julian Watford. is a 60 y.o. male who presents for an evaluation of CVA at the request of Dr. Leonie Dunlap. Their medical history includes hyperlipidemia, hypertension, hypothyroidism, COPD, tobacco abuse.  He presented to the emergency department July 08, 2021 with new onset left-sided weakness.  CTA was performed which showed a distal ACA occlusion.  DAPT was started.  His stroke appeared to be related to a cardioembolic source and so he is referred to discuss loop recorder implant.     Past Medical History:  Diagnosis Date   COPD (chronic obstructive pulmonary disease) ([place])    Heart rate fast    Stroke [facility])     Past Surgical History:  Procedure Laterality Date   BUBBLE STUDY  07/11/2021   Procedure: BUBBLE STUDY;  Surgeon: Julian Pain, MD;  Location: [facility];  Service: Cardiovascular;;   CHOLECYSTECTOMY     TEE WITHOUT CARDIOVERSION N/A 07/11/2021   Procedure: TRANSESOPHAGEAL ECHOCARDIOGRAM (TEE);  Surgeon: Julian Pain, MD;  Location: [facility] ENDOSCOPY;  Service: Cardiovascular;  Laterality: N/A;    Current Medications: No outpatient medications have been marked as taking for the 09/07/21 encounter (Appointment) with Julian Epley, MD.     Allergies:   Penicillins, Cefazolin, Ibuprofen, Sulfa antibiotics, and Sulfur   Social History   Socioeconomic History   Marital status: Widowed    Spouse name: Not on file   Number of children: Not on file   Years of education: Not on file   Highest education level: 7th grade  Occupational History   Not on file  Tobacco Use   Smoking status: Every Day    Packs/day: 0.50    Types: Cigarettes   Smokeless tobacco:  Never  Vaping Use   Vaping Use: Never used  Substance and Sexual Activity   Alcohol use: Not Currently   Drug use: Never   Sexual activity: Not on file  Other Topics Concern   Not on file  Social History Narrative   Widowed   7th grade education reported   Social Determinants of Health   Financial Resource Strain: Low Risk    Difficulty of Paying Living Expenses: Not hard at all  Food Insecurity: No Food Insecurity   Worried About Charity fundraiser in the Last Year: Never true   Arboriculturist in the Last Year: Never true  Transportation Needs: No Transportation Needs   Lack of Transportation (Medical): No   Lack of Transportation (Non-Medical): No  Physical Activity: Not on file  Stress: No Stress Concern Present   Feeling of Stress : Only a little  Social Connections: Moderately Integrated   Frequency of Communication with Friends and Family: Twice a week   Frequency of Social Gatherings with Friends and Family: Twice a week   Attends Religious Services: 1 to 4 times per year   Active Member of [employer] or Organizations: Yes   Attends Archivist Meetings: 1 to 4 times per year   Marital Status: Widowed     Family History: The patient's family history includes Aneurysm (age of onset: 16) in his father; Hyperthyroidism in his mother.  ROS:  Please see the history of present illness.    All other systems reviewed and are negative.  EKGs/Labs/Other Studies Reviewed:    The following studies were reviewed today:  July 11, 2021 transesophageal echo Left ventricular function normal, 60% Right ventricular function normal No left atrial appendage thrombus Mild MR PFO present, small  July 21, 2021 DVT No thrombus   EKG:  The ekg ordered today demonstrates ***   Recent Labs: 07/08/2021: ALT 18 07/09/2021: Hemoglobin 15.7; Platelets 226 07/11/2021: BUN 14; Creatinine, Ser 0.92; Potassium 3.9; Sodium 137  Recent Lipid Panel    Component Value  Date/Time   CHOL 185 07/08/2021 0905   TRIG 72 07/08/2021 0905   HDL 33 (L) 07/08/2021 0905   CHOLHDL 5.6 07/08/2021 0905   VLDL 14 07/08/2021 0905   LDLCALC 138 (H) 07/08/2021 0905    Physical Exam:    VS:  There were no vitals taken for this visit.    Wt Readings from Last 3 Encounters:  07/28/21 162 lb (73.5 kg)  07/18/21 158 lb 14.4 oz (72.1 kg)  07/11/21 163 lb 9.3 oz (74.2 kg)     GEN: *** Well nourished, well developed in no acute distress HEENT: Normal NECK: No JVD; No carotid bruits LYMPHATICS: No lymphadenopathy CARDIAC: ***RRR, no murmurs, rubs, gallops RESPIRATORY:  Clear to auscultation without rales, wheezing or rhonchi  ABDOMEN: Soft, non-tender, non-distended MUSCULOSKELETAL:  No edema; No deformity  SKIN: Warm and dry NEUROLOGIC:  Alert and oriented x 3 PSYCHIATRIC:  Normal affect       ASSESSMENT:    1. Cerebrovascular accident (CVA) due to embolism of right anterior cerebral artery ([place])   2. Essential hypertension   3. Chronic obstructive pulmonary disease, unspecified COPD type ([place])    PLAN:    In order of problems listed above:         Total time spent with patient today *** minutes. This includes reviewing records, evaluating the patient and coordinating care.  Medication Adjustments/Labs and Tests Ordered: Current medicines are reviewed at length with the patient today.  Concerns regarding medicines are outlined above.  No orders of the defined types were placed in this encounter.  No orders of the defined types were placed in this encounter.    Signed, Julian Cork. Quentin Ore, MD, [facility], [facility] 09/07/2021 6:19 AM    Electrophysiology [facility]    ------------------------------------------------   SURGEON:  Julian Mage, MD    PREPROCEDURE DIAGNOSIS:  Cryptogenic stroke    POSTPROCEDURE DIAGNOSIS:  Cryptogenic stroke     PROCEDURES:   1. Implantable loop recorder implantation     INTRODUCTION:  Julian Dunlap. is a 60 y.o. patient with a history of cryptogenic stroke. Inpatient telemetry has been reviewed and not shown atrial fibrillation. The patient therefore presents today for implantable loop implantation.     DESCRIPTION OF PROCEDURE:  Informed written consent was obtained.  The patient required no sedation for the procedure today.  Mapping over the patient's chest was performed to identify the area where electrograms were most prominent for ILR recording.  This area was found to be the left parasternal region over the 4th intercostal space. The patients left chest was therefore prepped and draped in the usual sterile fashion. The skin overlying the left parasternal region was infiltrated with lidocaine for local analgesia.  A 0.5-cm incision was made over the left parasternal region over the 3rd intercostal space.  A subcutaneous ILR pocket was fashioned using a combination of  sharp and blunt dissection.  A Medtronic Reveal Linq model [id number] (***) implantable loop recorder was then placed into the pocket  R waves were very prominent and measured >0.72mV.  Steri- Strips and a sterile dressing were then applied.  There were no early apparent complications.     CONCLUSIONS:   1. Successful implantation of a Medtronic Reveal LINQ implantable loop recorder for a history of cryptogenic stroke  2. No early apparent complications.   Julian Mage, MD 12/13/2020 3:51 PM

## 2021-11-01 ENCOUNTER — Telehealth: Payer: Self-pay

## 2021-11-01 NOTE — Telephone Encounter (Signed)
Called patient due to not showing up for his appointment. Had to leave voice message asking for a return call. Appointment made as a no show.

## 2022-01-12 ENCOUNTER — Encounter (HOSPITAL_COMMUNITY): Payer: Self-pay | Admitting: Radiology

## 2022-10-22 IMAGING — MR MR HEAD W/O CM
7 of 10 series · 30 of 48 positions shown · non-contrast
Comparison: Same-day noncontrast CT head

CLINICAL DATA: Left-sided numbness, weakness, facial droop

EXAM:
MRI HEAD WITHOUT CONTRAST
TECHNIQUE: Multiplanar, multiecho pulse sequences of the brain and surrounding
structures were obtained without intravenous contrast.

[Series 2: DWI · axial · 3.0mm · 0.94mm/px · z∈[-19,+117]mm · 9 of 99 slices shown (1 of 2)]
[im 1/99]
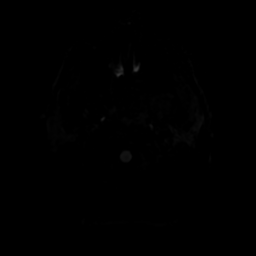
[im 13/99]
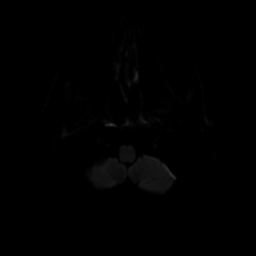
[im 25/99]
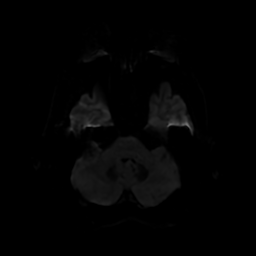
[im 37/99]
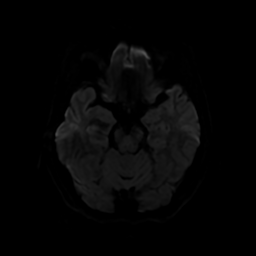
[im 50/99]
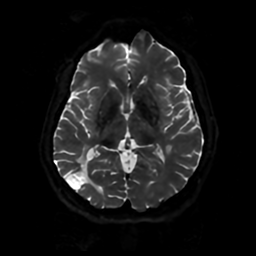
[im 62/99]
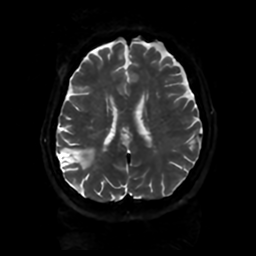
[im 74/99]
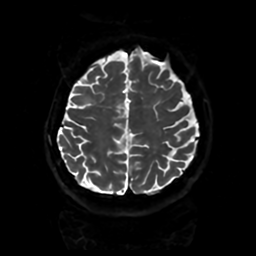
[im 86/99]
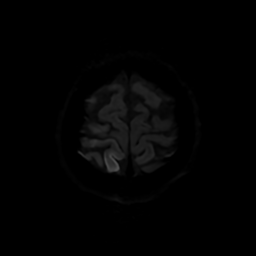
[im 99/99]
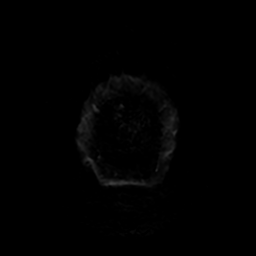

[Series 3: DWI · coronal · 4.0mm · 0.94mm/px · 7 of 72 slices shown (2 of 2)]
[im 1/72]
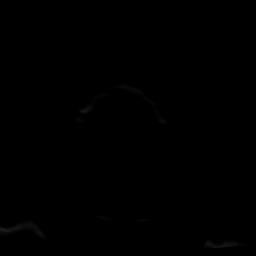
[im 12/72]
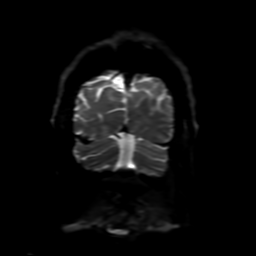
[im 24/72]
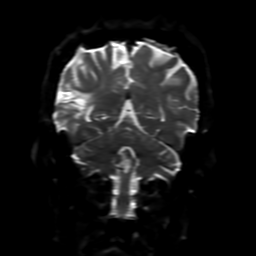
[im 36/72]
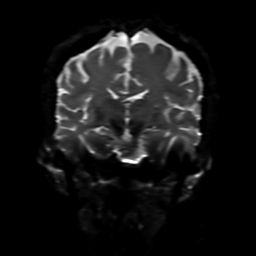
[im 48/72]
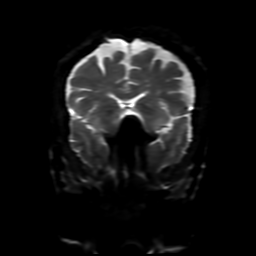
[im 60/72]
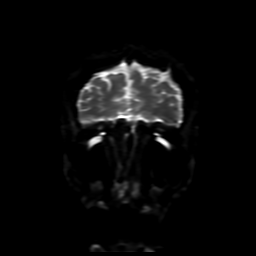
[im 72/72]
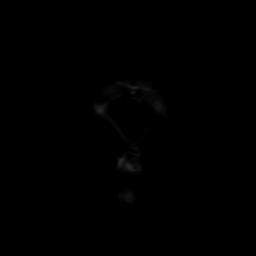

[Series 4: FLAIR · axial · 4.0mm · 0.45mm/px · z∈[-19,+112]mm · 3 of 33 slices shown (1 of 2)]
[im 1/33]
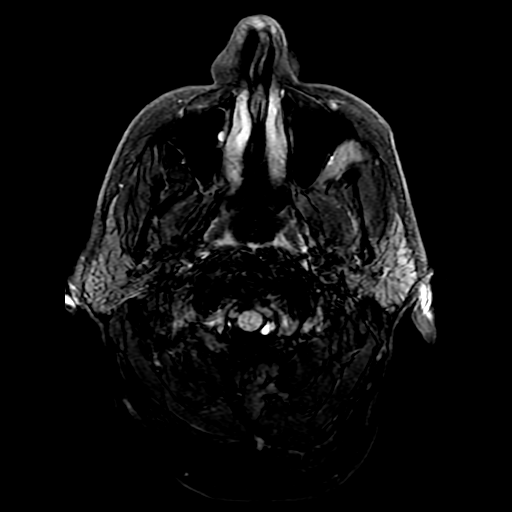
[im 17/33]
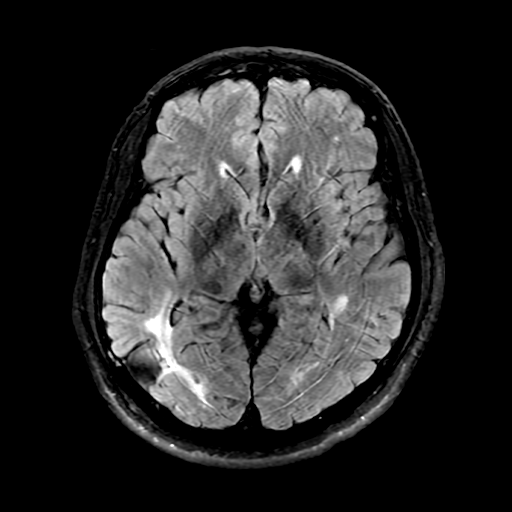
[im 33/33]
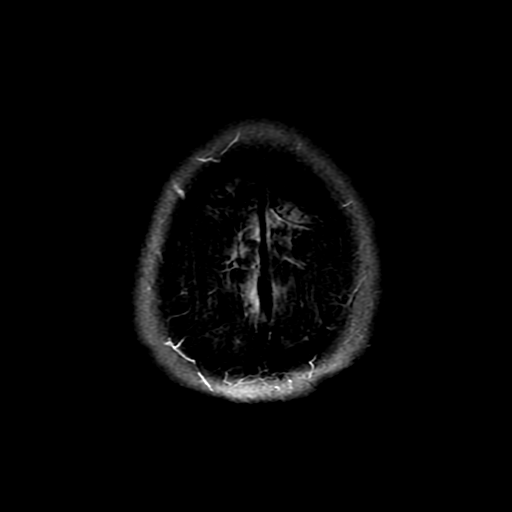

[Series 5: FLAIR · sagittal · 5.0mm · 0.23mm/px · 2 of 25 slices shown (2 of 2)]
[im 1/25]
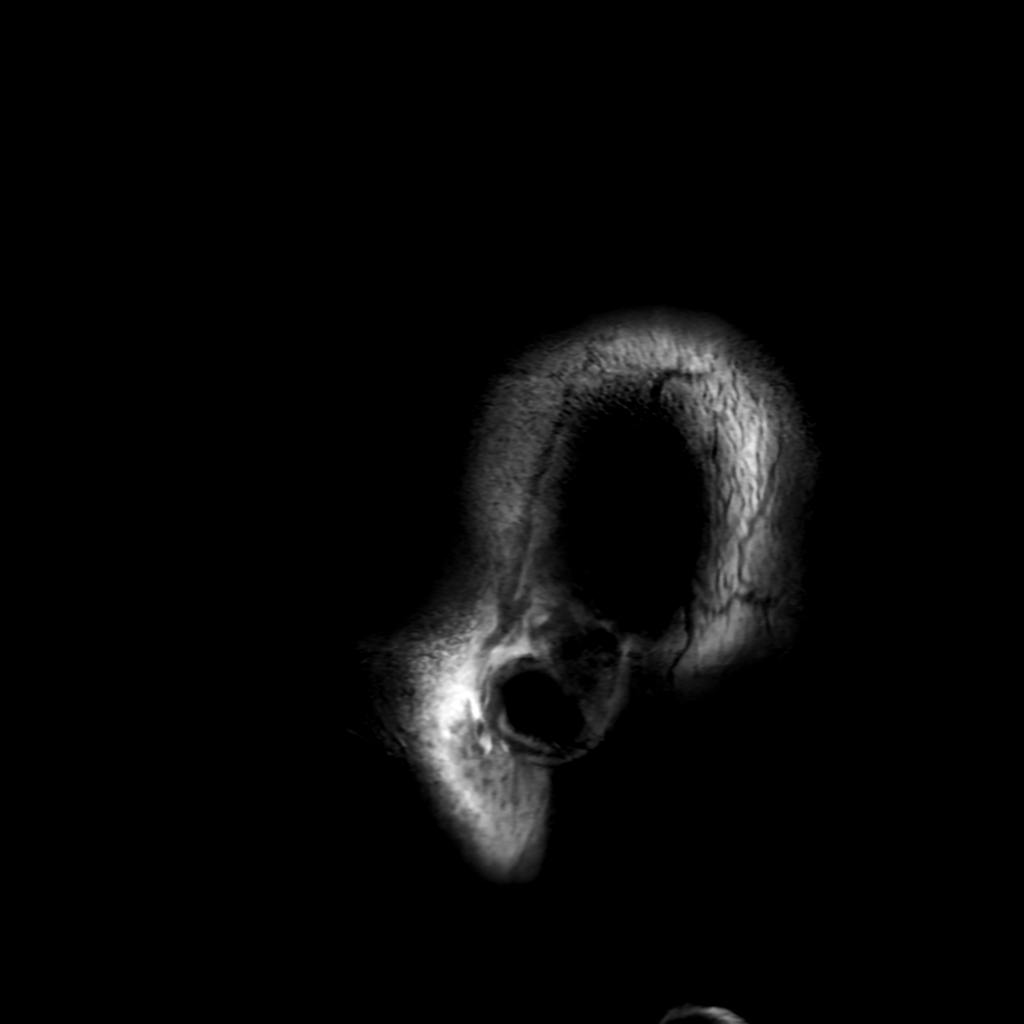
[im 25/25]
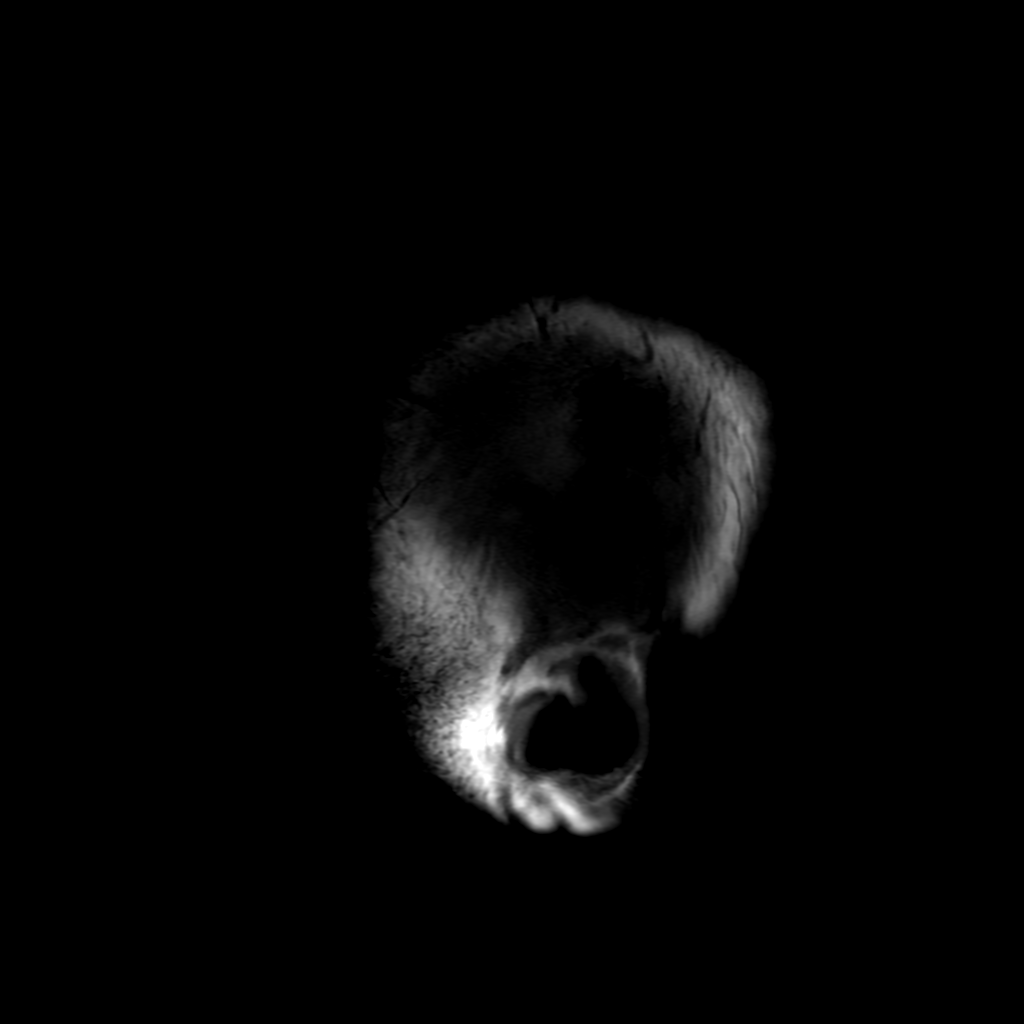

[Series 9: T2 · coronal · 5.0mm · 0.39mm/px · 1 of 28 slices shown]
[im 1/28]
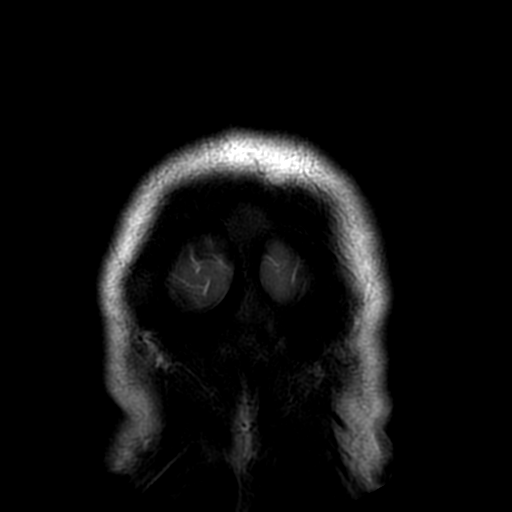

[Series 250: ADC · axial · 3.0mm · 0.94mm/px · z∈[-19,+117]mm · 5 of 50 slices shown (1 of 2)]
[im 1/50]
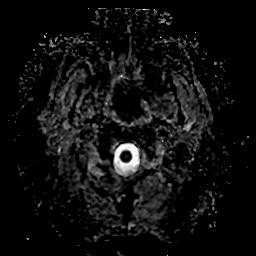
[im 13/50]
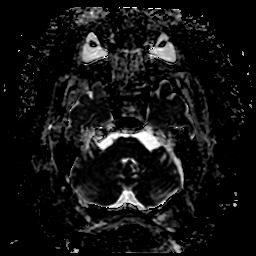
[im 25/50]
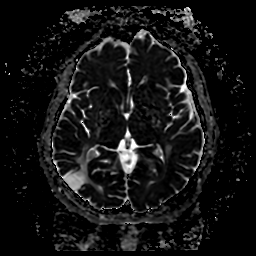
[im 37/50]
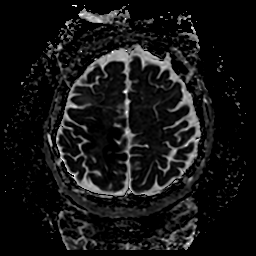
[im 50/50]
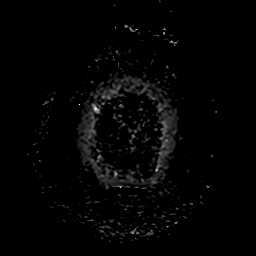

[Series 350: ADC · coronal · 4.0mm · 0.94mm/px · 3 of 35 slices shown (2 of 2)]
[im 1/35]
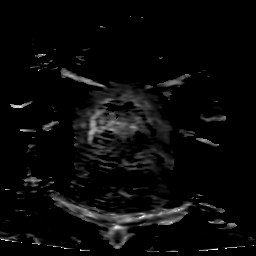
[im 18/35]
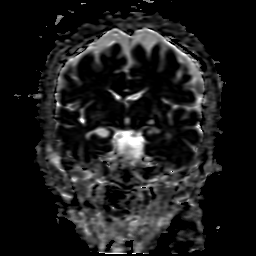
[im 35/35]
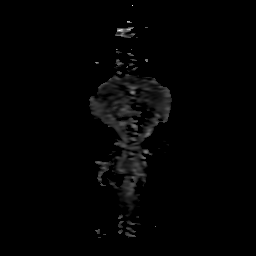

[30 of 48 positions shown; findings below may reference images not displayed]

FINDINGS: Brain: There is diffusion restriction in the right parasagittal
frontal and parietal lobes with minimal associated FLAIR signal
abnormality consistent with evolving acute infarct. The infarct
extends to involve the right body of the corpus callosum. There is
also minimal involvement of the cortex of the pre and postcentral
gyri.

There is a remote infarct in the right parietal lobe more
inferolaterally with associated encephalomalacia, chronic
hemosiderin staining, and gliosis.

Scattered additional foci of FLAIR signal abnormality in the
subcortical and periventricular white matter likely reflects sequela
of chronic white matter microangiopathy. There is mild global
parenchymal volume loss without discernible lobar predominance. The
ventricles are not enlarged. There is no mass lesion. There is no
midline shift.

Vascular: The major flow voids are present. The vasculature is
better assessed on the same day CTA head/neck.

Skull and upper cervical spine: Normal marrow signal.

Sinuses/Orbits: The imaged paranasal sinuses are clear. The globes
and orbits are unremarkable.

Other: None.
IMPRESSION: 1. Acute infarct involving the right parasagittal frontal and
parietal lobes as well as portions of the cortex of the right pre
and postcentral gyri and body of the corpus callosum, consistent
with evolving ACA distribution infarct in the setting of distal ACA
occlusion as seen on the prior CTA.
2. Remote right parietal lobe infarct.

## 2022-10-22 IMAGING — CT CT HEAD CODE STROKE
3 series · 15 of 47 positions shown, 18 images · non-contrast
Comparison: CT Head from April 09, 2020.

CLINICAL DATA: Code stroke.  Neuro deficit, acute, stroke suspected

EXAM:
CT HEAD WITHOUT CONTRAST
TECHNIQUE: Contiguous axial images were obtained from the base of the skull
through the vertex without intravenous contrast.

[Series 3: head 5.0 st · axial · 0.39mm/px · z∈[-155,-15]mm · 9 of 34 slices shown, 12 images]
[im 3/34  brain]
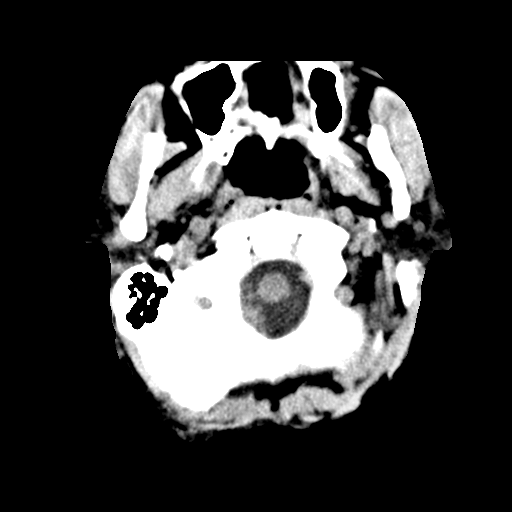
[im 3/34  bone]
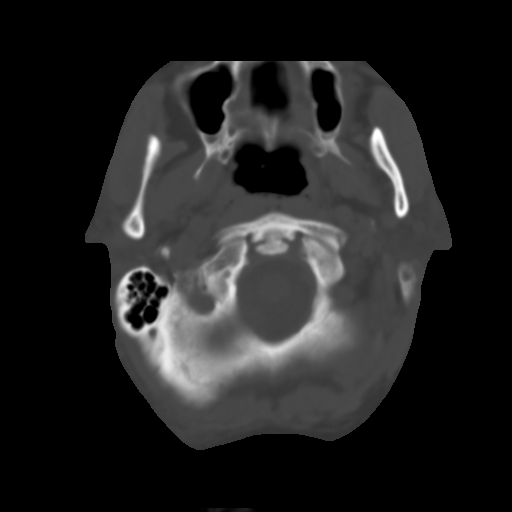
[im 6/34  brain]
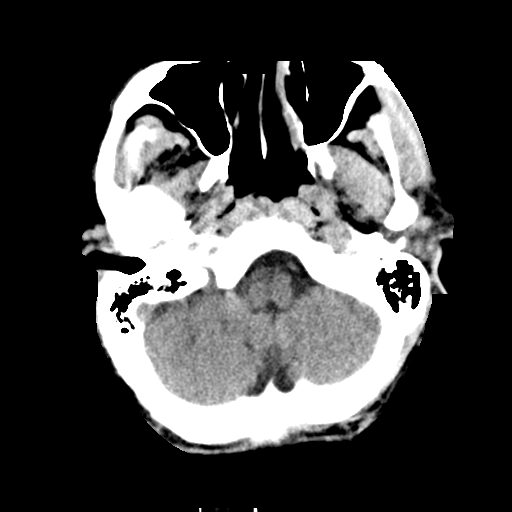
[im 10/34  brain]
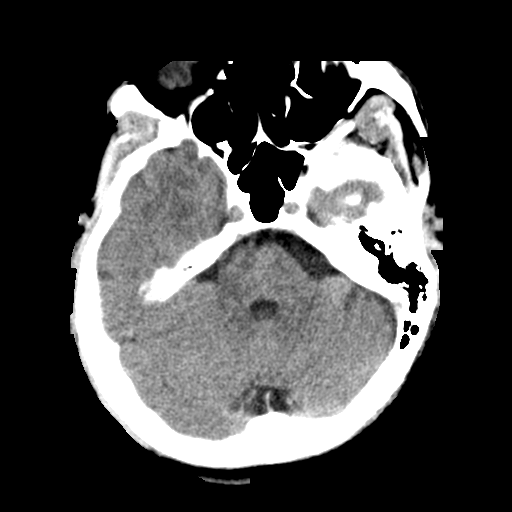
[im 13/34  brain]
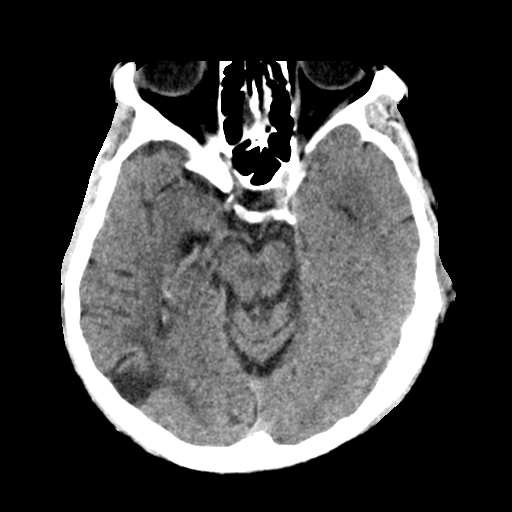
[im 18/34  brain]
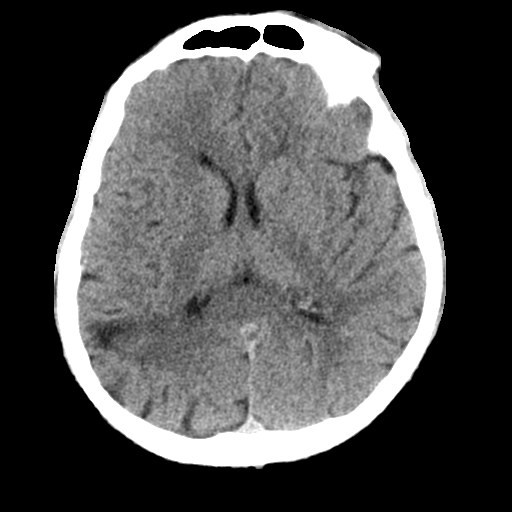
[im 18/34  bone]
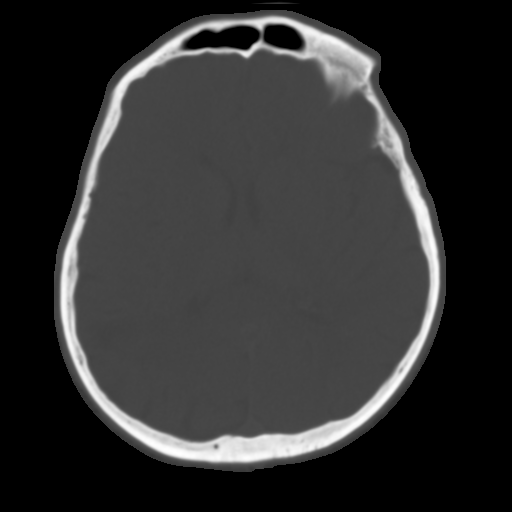
[im 21/34  brain]
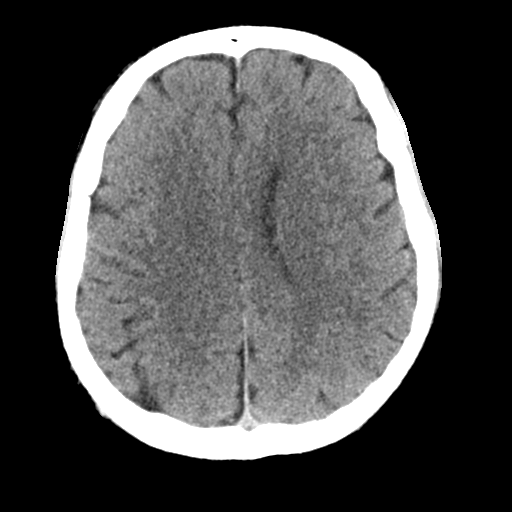
[im 24/34  brain]
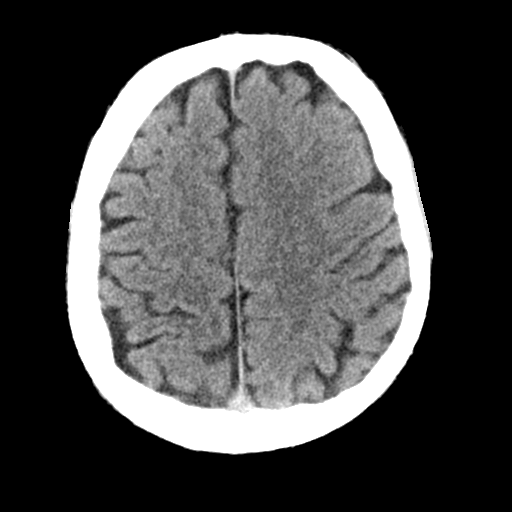
[im 28/34  brain]
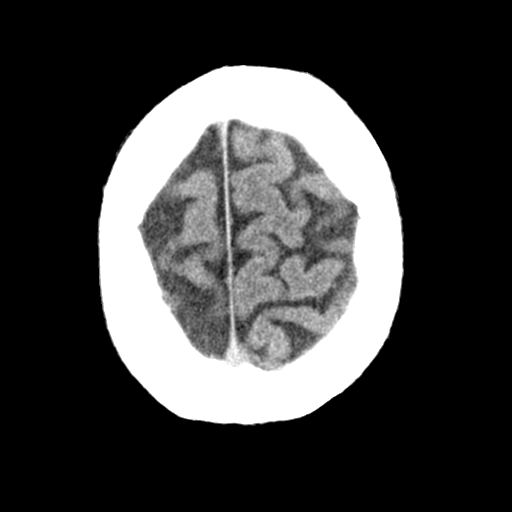
[im 31/34  brain]
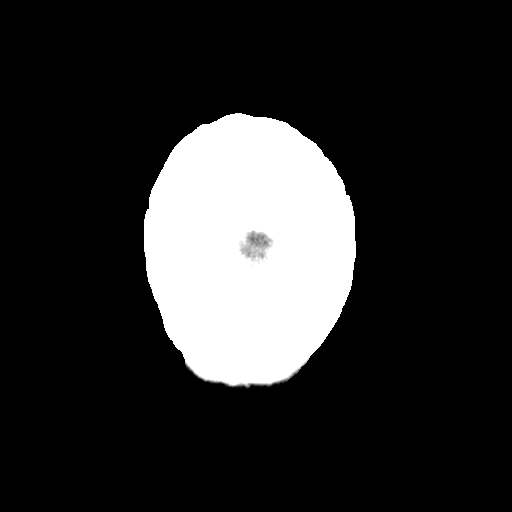
[im 31/34  bone]
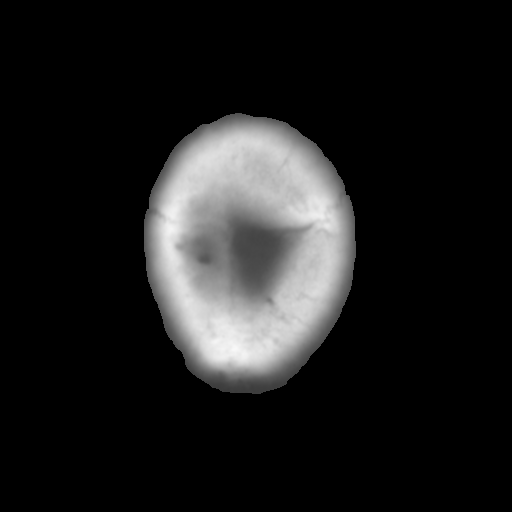

[Series 5: head 3.0 cor st · coronal · 0.33mm/px · 3 of 67 slices shown]
[im 23/67  brain]
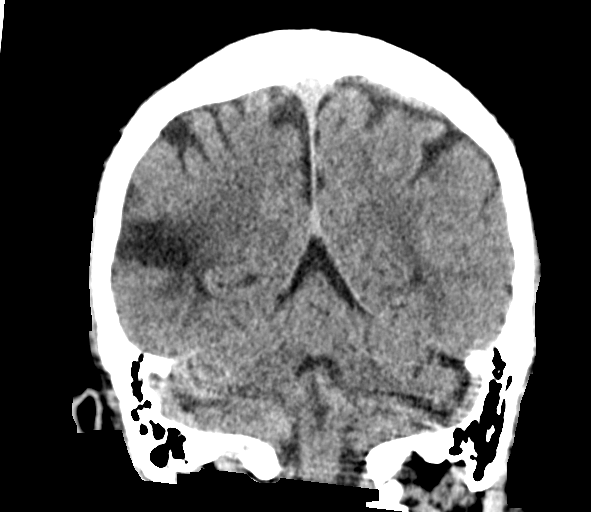
[im 30/67  brain]
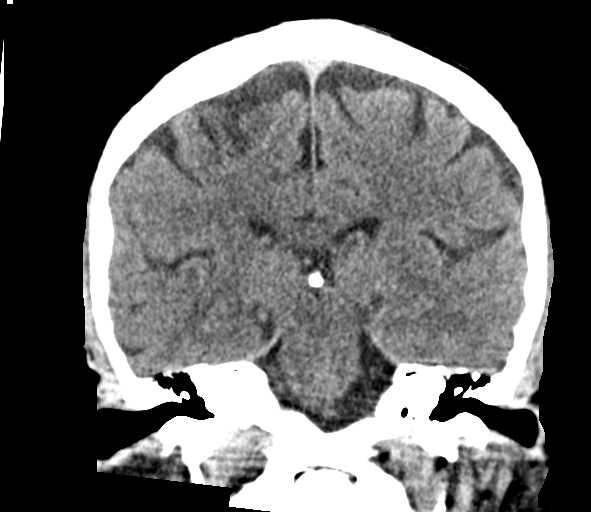
[im 37/67  brain]
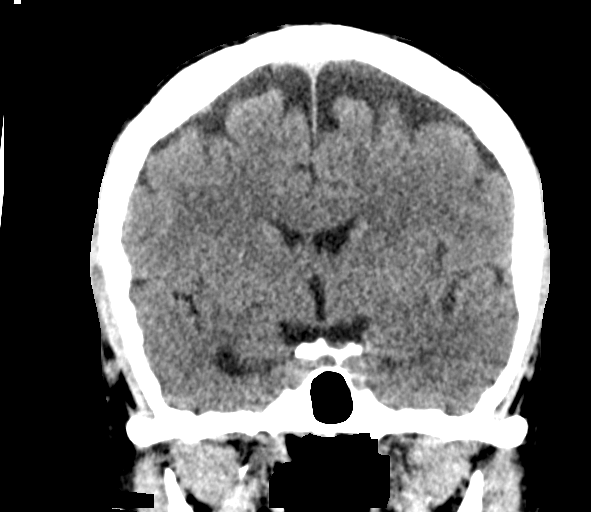

[Series 6: head 3.0 sag st · sagittal · 0.33mm/px · 3 of 67 slices shown]
[im 23/67  brain]
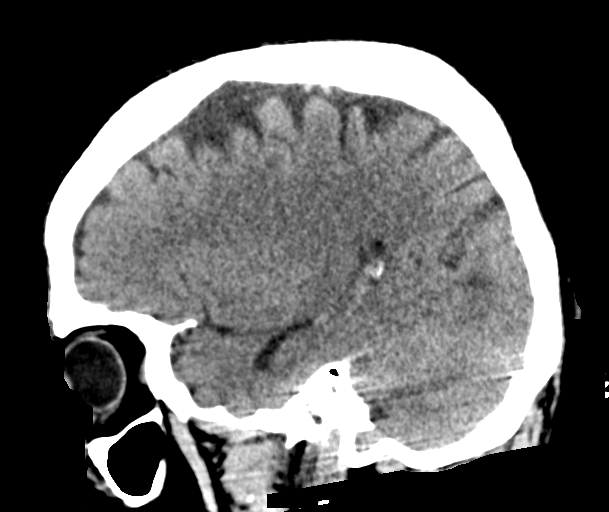
[im 34/67  brain]
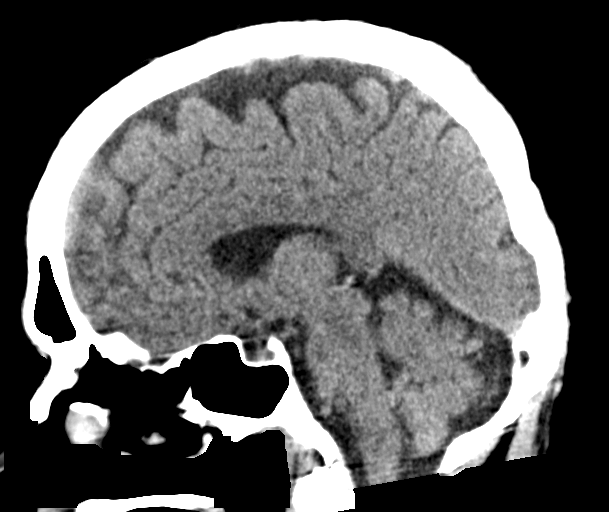
[im 45/67  brain]
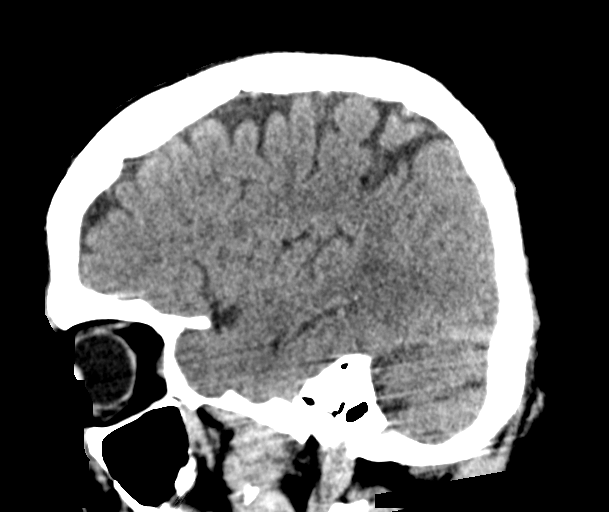

[15 of 47 positions shown; findings below may reference images not displayed]

FINDINGS: Brain: No evidence of acute large vascular infarction, hemorrhage,
hydrocephalus, extra-axial collection or mass lesion/mass effect.
Remote right infarct infarct with encephalomalacia. Mild patchy
white matter hypoattenuation, nonspecific but compatible with
chronic microvascular ischemic disease. Mild atrophy.

Vascular: No hyperdense vessel identified.

Skull: No acute fracture

Sinuses/Orbits: Clear sinuses.  No acute orbital findings.

Other: No mastoid effusions.

ASPECTS ([place] Stroke Program Early CT Score) Total score (0-10
with 10 being normal): 10.
IMPRESSION: 1. No evidence of acute large vascular territory infarct or acute
hemorrhage. ASPECTS is 10.
2. Remote right parietal infarct.

Code stroke imaging results were communicated on 07/08/2021 at [DATE] to provider Dr. Ajuda via secure text paging.
